# Patient Record
Sex: Male | Born: 1995
Health system: Southern US, Community
[De-identification: ages and names within clinical notes are randomized; demographics above are authoritative.]

## PROBLEM LIST (undated history)

## (undated) DIAGNOSIS — Z9109 Other allergy status, other than to drugs and biological substances: Secondary | ICD-10-CM

## (undated) DIAGNOSIS — Z8701 Personal history of pneumonia (recurrent): Secondary | ICD-10-CM

## (undated) DIAGNOSIS — S129XXA Fracture of neck, unspecified, initial encounter: Secondary | ICD-10-CM

## (undated) DIAGNOSIS — IMO0002 Reserved for concepts with insufficient information to code with codable children: Secondary | ICD-10-CM

## (undated) DIAGNOSIS — Z9289 Personal history of other medical treatment: Secondary | ICD-10-CM

## (undated) HISTORY — PX: WISDOM TOOTH EXTRACTION: SHX21

## (undated) HISTORY — DX: Personal history of pneumonia (recurrent): Z87.01

## (undated) HISTORY — DX: Personal history of other medical treatment: Z92.89

## (undated) HISTORY — DX: Reserved for concepts with insufficient information to code with codable children: IMO0002

---
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
## 2013-01-08 ENCOUNTER — Encounter: Payer: Self-pay | Admitting: Family Medicine

## 2013-01-08 ENCOUNTER — Ambulatory Visit: Payer: Self-pay | Admitting: Family Medicine

## 2013-01-09 ENCOUNTER — Encounter: Payer: Self-pay | Admitting: Family Medicine

## 2013-01-09 ENCOUNTER — Ambulatory Visit (INDEPENDENT_AMBULATORY_CARE_PROVIDER_SITE_OTHER)
Admission: RE | Admit: 2013-01-09 | Discharge: 2013-01-09 | Disposition: A | Discharge status: Home / Self Care | Payer: BC Managed Care – PPO | Source: Ambulatory Visit | Attending: Family Medicine | Admitting: Family Medicine

## 2013-01-09 ENCOUNTER — Ambulatory Visit (INDEPENDENT_AMBULATORY_CARE_PROVIDER_SITE_OTHER): Payer: BC Managed Care – PPO | Admitting: Family Medicine

## 2013-01-09 VITALS — BP 142/88 | HR 53 | Temp 97.5°F | Resp 20 | Ht 72.5 in | Wt 158.5 lb

## 2013-01-09 DIAGNOSIS — M7660 Achilles tendinitis, unspecified leg: Secondary | ICD-10-CM

## 2013-01-09 DIAGNOSIS — IMO0002 Reserved for concepts with insufficient information to code with codable children: Secondary | ICD-10-CM

## 2013-01-09 DIAGNOSIS — M7662 Achilles tendinitis, left leg: Secondary | ICD-10-CM

## 2013-01-09 DIAGNOSIS — S93402S Sprain of unspecified ligament of left ankle, sequela: Secondary | ICD-10-CM

## 2013-01-09 NOTE — Progress Notes (Signed)
Office Note 01/15/2013  CC:  Chief Complaint  Patient presents with  . Establish Care    NP to establish.; pt c/o possible athlete's feet, bilateral.    HPI:  Ricky [name] is a 17 y.o. White male who is here to establish care and discuss feet problem. Patient's most recent primary MD: Dr. Orson Aloe at [facility] in [place]. Old records were not reviewed prior to or during today's visit.  Describes recurrent chin splints x 2 yrs, also twisted left ankle repeatedly, most recently about 6-7 mo ago--he iced it and wore an akle support brace and it improved but since then it has returned --pain when he walks.  Describes the area as lateral ankle and heel of left foot.  Right ankle no problems.  No hx of MD eval for ankle.  Past Medical History  Diagnosis Date  . Persistent asthma     Multiple(>5) ED visits for acute asthma exacerbations from 2007-2014  . Environmental allergies   . History of pneumonia   . Neck fracture     C5 -C6    Past Surgical History  Procedure Laterality Date  . Wisdom tooth extraction      Family History  Problem Relation Age of Onset  . Arthritis Maternal Grandmother   . Hypertension Maternal Grandmother     History   Social History  . Marital Status: Single    Spouse Name: N/A    Number of Children: N/A  . Years of Education: N/A   Occupational History  . Not on file.   Social History Main Topics  . Smoking status: Never Smoker   . Smokeless tobacco: Not on file  . Alcohol Use: No  . Drug Use: No  . Sexually Active: Not on file   Other Topics Concern  . Not on file   Social History Narrative   Freshman at [employer].   [place]   Lives with parents.  Has 1 bro and 1 sister.   No T/A/Ds.    Outpatient Encounter Prescriptions as of 01/09/2013  Medication Sig Dispense Refill  . acetaminophen (TYLENOL) 500 MG tablet Take 1,000 mg by mouth every 6 (six) hours as needed. For pain.      Marland Kitchen albuterol (PROVENTIL HFA;VENTOLIN  HFA) 108 (90 BASE) MCG/ACT inhaler Inhale 2-4 puffs into the lungs every 2 (two) hours as needed. Shortness of breath and wheezing      . diphenhydrAMINE (BENADRYL) 25 mg capsule Take 1 capsule (25 mg total) by mouth every 6 (six) hours as needed for itching.  30 capsule  0  . EPINEPHrine (EPIPEN) 0.3 mg/0.3 mL DEVI Inject 0.3 mLs (0.3 mg total) into the muscle as needed.  1 Device  1  . ibuprofen (ADVIL,MOTRIN) 200 MG tablet Take 400 mg by mouth every 8 (eight) hours as needed. For pain.      . [DISCONTINUED] azithromycin (ZITHROMAX) 250 MG tablet Take 1 tablet (250 mg total) by mouth daily. Take first 2 tablets together, then 1 every day until finished.  6 tablet  0  . [DISCONTINUED] famotidine (PEPCID) 20 MG tablet Take 1 tablet (20 mg total) by mouth 2 (two) times daily.  30 tablet  0  . [DISCONTINUED] oxyCODONE-acetaminophen (PERCOCET/ROXICET) 5-325 MG per tablet Take 1 tablet by mouth every 6 (six) hours as needed for pain.  6 tablet  0  . [DISCONTINUED] predniSONE (DELTASONE) 20 MG tablet Take 3 tablets (60 mg total) by mouth daily.  15 tablet  0  No facility-administered encounter medications on file as of 01/09/2013.    Allergies  Allergen Reactions  . Penicillins Anaphylaxis  . Azithromycin Hives  . Oxycodone-Acetaminophen Hives    ROS Review of Systems  Constitutional: Negative for fever and fatigue.  HENT: Negative for congestion and sore throat.   Eyes: Negative for visual disturbance.  Respiratory: Negative for cough.   Cardiovascular: Negative for chest pain.  Gastrointestinal: Negative for nausea and abdominal pain.  Genitourinary: Negative for dysuria.  Musculoskeletal: Negative for back pain and joint swelling.  Skin: Negative for rash.  Neurological: Negative for weakness and headaches.  Hematological: Negative for adenopathy.    PE; Blood pressure 142/88, pulse 53, temperature 97.5 F (36.4 C), temperature source Oral, resp. rate 20, height 6' 0.5" (1.842 m),  weight 158 lb 8 oz (71.895 kg), SpO2 97.00%. Gen: Alert, well appearing.  Patient is oriented to person, place, time, and situation. Feet and ankles without any edema..  No rash.  Right ankle without tenderness or swelling.  Pain with palpation of left ankle/dorsum of foot just anterior to lateral malleolus.  Pain with general ROM of left ankle, also tenderness noted in distal achilles tendon.  Left ankle with increased laxity and pain with anterior drawer testing.  Talar tilt is ok.  Pertinent labs:  .none today  ASSESSMENT AND PLAN:   Ankle sprain Chronic symptoms. With some achilles tendonitis as well. Check x-ray today. Continue rest and brace. Refer to Dr. Pearletha Forge in Sports Medicine at [facility].    Return if symptoms worsen or fail to improve.

## 2013-01-14 ENCOUNTER — Encounter: Payer: Self-pay | Admitting: Family Medicine

## 2013-01-15 DIAGNOSIS — S93409A Sprain of unspecified ligament of unspecified ankle, initial encounter: Secondary | ICD-10-CM | POA: Insufficient documentation

## 2013-01-15 NOTE — Assessment & Plan Note (Addendum)
Chronic symptoms. With some achilles tendonitis as well. Check x-ray today. Continue rest and brace. Refer to Dr. Pearletha Forge in Sports Medicine at [facility].

## 2013-01-21 ENCOUNTER — Ambulatory Visit (INDEPENDENT_AMBULATORY_CARE_PROVIDER_SITE_OTHER): Payer: BC Managed Care – PPO | Admitting: Family Medicine

## 2013-01-21 ENCOUNTER — Encounter: Payer: Self-pay | Admitting: Family Medicine

## 2013-01-21 VITALS — BP 134/71 | HR 72 | Ht 73.0 in | Wt 155.0 lb

## 2013-01-21 DIAGNOSIS — M25572 Pain in left ankle and joints of left foot: Secondary | ICD-10-CM

## 2013-01-21 DIAGNOSIS — M25579 Pain in unspecified ankle and joints of unspecified foot: Secondary | ICD-10-CM

## 2013-01-21 NOTE — Patient Instructions (Addendum)
You have chronic ankle instability from prior sprains. Ice the area for 15 minutes at a time, 3-4 times a day Take aleve 1-2 tabs twice a day with food as needed. Ankle brace when painful and with sports for stability (can use as often as possible when walking also). Start home theraband exercises 3 sets of 10 once a day. When it feels comfortable add the balance exercises I showed you as well. Consider physical therapy for strengthening and balance exercises. If not improving as expected, we may repeat x-rays or consider further testing like an MRI. Follow up with me in 6 weeks for reevaluation.  You also have achilles tendinopathy. Tylenol or aleve as needed for pain Lowering/raise on a step exercises 3 x 10 once a day Can add heel walks, toe walks forward and backward as well Ice the area 15 minutes at a time 3-4 times a day as needed. Avoid uneven ground, hills as much as possible. Heel lifts to unload the achilles. Inserts with good arch support may be helpful. Consider formal physical therapy, nitro patches if not improving.

## 2013-01-21 NOTE — Progress Notes (Signed)
Subjective:    Patient ID: Ricky [name], male    DOB: 04-Feb-1996, 17 y.o.   MRN: [id number]  PCP: Dr. Milinda Cave  HPI 17 yo M here for left ankle pain.  Patient reports he has a history of multiple ankle sprains to left ankle. Started about 2 years ago when playing basketball - inversion injury. Never rehabilitated this. Uses a velcro brace and still ices it when it hurts. Bothers more when up and walking around. Any movements cause pain. Primary pain is lateral ankle but also having posterior pain now. Not taking any medications for this.  Past Medical History  Diagnosis Date  . Persistent asthma     Multiple(>5) ED visits for acute asthma exacerbations from 2007-2014  . Environmental allergies   . History of pneumonia   . Neck fracture     C5 -C6    Current Outpatient Prescriptions on File Prior to Visit  Medication Sig Dispense Refill  . albuterol (PROVENTIL HFA;VENTOLIN HFA) 108 (90 BASE) MCG/ACT inhaler Inhale 2-4 puffs into the lungs every 2 (two) hours as needed. Shortness of breath and wheezing      . EPINEPHrine (EPIPEN) 0.3 mg/0.3 mL DEVI Inject 0.3 mLs (0.3 mg total) into the muscle as needed.  1 Device  1   No current facility-administered medications on file prior to visit.    Past Surgical History  Procedure Laterality Date  . Wisdom tooth extraction      Allergies  Allergen Reactions  . Penicillins Anaphylaxis  . Azithromycin Hives  . Oxycodone-Acetaminophen Hives    History   Social History  . Marital Status: Single    Spouse Name: N/A    Number of Children: N/A  . Years of Education: N/A   Occupational History  . Not on file.   Social History Main Topics  . Smoking status: Never Smoker   . Smokeless tobacco: Not on file  . Alcohol Use: No  . Drug Use: No  . Sexually Active: Not on file   Other Topics Concern  . Not on file   Social History Narrative   Freshman at [employer].   [place].   Lives with parents.  Has 1  bro and 1 sister.   No T/A/Ds.    Family History  Problem Relation Age of Onset  . Arthritis Maternal Grandmother   . Hypertension Maternal Grandmother   . Sudden death Neg Hx   . Hyperlipidemia Neg Hx   . Heart attack Neg Hx   . Diabetes Neg Hx     BP 134/71  Pulse 72  Ht 6\' 1"  (1.854 m)  Wt 155 lb (70.308 kg)  BMI 20.45 kg/m2  Review of Systems See HPI above.    Objective:   Physical Exam Gen: NAD  L ankle: No gross deformity, swelling, ecchymoses FROM with 5/5 strength all directions. TTP over ATFL, peroneal tendons, achilles tendon at insertion.  No deltoid, medial malleolus, navicular, base 5th, other foot/ankle tenderness. 2+ ant drawer, 1+ talar tilt. Negative syndesmotic compression. Thompsons test negative. NV intact distally.    Assessment & Plan:  1. Left ankle pain - 2/2 combination of chronic ankle instability and achilles tendinopathy.  Declined formal physical therapy.  Shown home theraband strengthening exercises for ankle, balance exercises, and achilles strengthening exercises with handouts.  Heel lifts for achilles tendinopathy.  Icing, nsaids as needed.  ASO for stability. Avoid hills, uneven ground as much as possible.  F/u in 6 weeks or as needed for reevaluation.

## 2013-01-21 NOTE — Assessment & Plan Note (Signed)
2/2 combination of chronic ankle instability and achilles tendinopathy.  Declined formal physical therapy.  Shown home theraband strengthening exercises for ankle, balance exercises, and achilles strengthening exercises with handouts.  Heel lifts for achilles tendinopathy.  Icing, nsaids as needed.  ASO for stability. Avoid hills, uneven ground as much as possible.  F/u in 6 weeks or as needed for reevaluation.

## 2013-02-15 ENCOUNTER — Encounter: Payer: Self-pay | Admitting: Family Medicine

## 2013-02-15 ENCOUNTER — Ambulatory Visit (INDEPENDENT_AMBULATORY_CARE_PROVIDER_SITE_OTHER): Payer: BC Managed Care – PPO | Admitting: Family Medicine

## 2013-02-15 ENCOUNTER — Ambulatory Visit (INDEPENDENT_AMBULATORY_CARE_PROVIDER_SITE_OTHER)
Admission: RE | Admit: 2013-02-15 | Discharge: 2013-02-15 | Disposition: A | Discharge status: Home / Self Care | Payer: BC Managed Care – PPO | Source: Ambulatory Visit | Attending: Family Medicine | Admitting: Family Medicine

## 2013-02-15 VITALS — BP 136/90 | HR 61 | Temp 97.6°F | Resp 16 | Wt 157.2 lb

## 2013-02-15 DIAGNOSIS — M79672 Pain in left foot: Secondary | ICD-10-CM

## 2013-02-15 DIAGNOSIS — M79609 Pain in unspecified limb: Secondary | ICD-10-CM

## 2013-02-15 DIAGNOSIS — S93402A Sprain of unspecified ligament of left ankle, initial encounter: Secondary | ICD-10-CM

## 2013-02-15 DIAGNOSIS — M79673 Pain in unspecified foot: Secondary | ICD-10-CM | POA: Insufficient documentation

## 2013-02-15 DIAGNOSIS — S93409A Sprain of unspecified ligament of unspecified ankle, initial encounter: Secondary | ICD-10-CM

## 2013-02-15 NOTE — Patient Instructions (Signed)
Take ibuprofen 600mg  three times a day with food--take this for 1 week. Ice ankle 20 minutes on/off throughout the next 2 days. Use crutches prn, continue wearing ankle lace-up support.

## 2013-02-15 NOTE — Progress Notes (Signed)
OFFICE NOTE  02/15/2013  CC:  Chief Complaint  Patient presents with  . Ankle Injury    Pt c/o Left ankle injury from playing basketball yesterday w/swelling & pain     HPI: Patient is a 17 y.o. Caucasian male who is here for left ankle injury.  He has hx of chronic left ankle pain and instability s/p a sprain 2 yrs ago that he never rehabed. Landed on inverted left ankle yesterday playing basketball and he felt a pop.  Initially he could not bear wt on it.  +Swelling.  No meds taken.  +ice last night. Today he bear wt but hobbles on it.  Pertinent PMH:  Past Medical History  Diagnosis Date  . Persistent asthma     Multiple(>5) ED visits for acute asthma exacerbations from 2007-2014  . Environmental allergies   . History of pneumonia   . Neck fracture     C5 -C6  Recurrent left ankle sprain; chronic left ankle pain and instability.  MEDS:  Albuterol HFA 2 puffs q6h prn  PE: Blood pressure 136/90, pulse 61, temperature 97.6 F (36.4 C), temperature source Oral, resp. rate 16, weight 157 lb 4 oz (71.328 kg), SpO2 95.00%. Gen: Alert, well appearing.  Patient is oriented to person, place, time, and situation. Limping, no crutches or ankle support. Left ankle with diffuse mild swelling, without erythema.  Ankle ROM very limited due to pain.  I did not attempt talar tilt or anterior drawer testing due to patient's pain level. He is tender in all aspects of his left ankle, most prominently laterally and posteriorly. The distal 8 cm of the achilles tendon is tender including the calcaneal insertion site.  He also is tender on the plantar surface of his calcaneus and midfoot.  Also with tenderness of midfoot on dorsal surface. PT and DP pulses palpable bilat.    LAB: X -ray of left ankle and foot, radiologist's reading pending.  I suspect a small nondisplaced fracture of the posterior aspect of his left talus.  Otherwise no fracture seen.  IMPRESSION AND PLAN:  Acute left ankle  sprain, hx of chronic left ankle pain and instability. Now also with midfoot pain worrisome for ligamentous injury. Will refer to ortho ASAP. I recommended no wt bearing until he sees ortho--crutches rx given.  Wear ankle lace-up support. Ice ankle for the next 2d, ibuprofen 600mg  tid with food x 1 week.  An After Visit Summary was printed and given to the patient.  FOLLOW UP: to be determined (awaiting x-ray final reading and ortho appt set up to be done)

## 2013-10-16 ENCOUNTER — Ambulatory Visit: Payer: BC Managed Care – PPO | Admitting: Family Medicine

## 2013-10-17 ENCOUNTER — Encounter: Payer: Self-pay | Admitting: *Deleted

## 2013-10-17 ENCOUNTER — Ambulatory Visit (INDEPENDENT_AMBULATORY_CARE_PROVIDER_SITE_OTHER): Payer: BC Managed Care – PPO | Admitting: Nurse Practitioner

## 2013-10-17 ENCOUNTER — Encounter: Payer: Self-pay | Admitting: Nurse Practitioner

## 2013-10-17 VITALS — BP 146/69 | HR 63 | Temp 98.5°F | Ht 72.5 in | Wt 156.8 lb

## 2013-10-17 DIAGNOSIS — S99912A Unspecified injury of left ankle, initial encounter: Secondary | ICD-10-CM

## 2013-10-17 DIAGNOSIS — S99929A Unspecified injury of unspecified foot, initial encounter: Secondary | ICD-10-CM

## 2013-10-17 DIAGNOSIS — S99919A Unspecified injury of unspecified ankle, initial encounter: Secondary | ICD-10-CM

## 2013-10-17 DIAGNOSIS — S8990XA Unspecified injury of unspecified lower leg, initial encounter: Secondary | ICD-10-CM

## 2013-10-17 NOTE — Progress Notes (Signed)
   Subjective:    Patient ID: Ricky [name], male    DOB: [birth year], 18 y.o.   MRN: [id number]  Ankle Injury  The incident occurred more than 1 week ago (landed on heel 2 weeks ago playing basketball. This is a re-injury. Pt was seen 9 mos ago by PCP & sports med. ). The injury mechanism was a direct blow (doesn't think rolled). The pain is present in the left ankle. The quality of the pain is described as aching. The pain is mild. The pain has been intermittent since onset. Pertinent negatives include no inability to bear weight (painful weight bearing). The symptoms are aggravated by weight bearing and movement. He has tried ice for the symptoms. The treatment provided mild relief.      Review of Systems  Constitutional: Negative for fever and activity change.  Musculoskeletal: Positive for arthralgias (L ankle pain).       Objective:   Physical Exam  Vitals reviewed. Constitutional: He is oriented to person, place, and time. He appears well-developed and well-nourished.  HENT:  Head: Normocephalic and atraumatic.  Eyes: Conjunctivae are normal. Right eye exhibits no discharge. Left eye exhibits no discharge.  Cardiovascular: Normal rate.   Pulmonary/Chest: Effort normal.  Musculoskeletal: He exhibits edema and tenderness.  Lateral & medial ankle tenderness to palpation, painful, but FROM.   Neurological: He is alert and oriented to person, place, and time.  Skin: Skin is warm and dry.  Psychiatric: He has a normal mood and affect. His behavior is normal. Thought content normal.          Assessment & Plan:  1. Left ankle injury Chronic instability, re-injured 2 wa. See Dr Pearletha ForgeHudnall. Ice followed by heat. Wear ASO. Aspercream.  Weight bearing as tolerated. Avoid long walks. No basketball or running.

## 2013-10-17 NOTE — Progress Notes (Signed)
Pre-visit discussion using our clinic review tool. No additional management support is needed unless otherwise documented below in the visit note.  

## 2013-10-17 NOTE — Patient Instructions (Signed)
I have cut & pasted Dr Lazaro ArmsHudnall's instructions from May, '14. Please read. Please follow up with him. This is a chronic injury. You must rehabilitate your ankle as he has recommended to avoid surgery and/or further injury.  You have chronic ankle instability from prior sprains.  Ice the area for 15 minutes at a time, 3-4 times a day  Take aleve 1-2 tabs twice a day with food as needed.  Ankle brace when painful and with sports for stability (can use as often as possible when walking also).  Start home theraband exercises 3 sets of 10 once a day.  When it feels comfortable add the balance exercises I showed you as well.  Consider physical therapy for strengthening and balance exercises.  If not improving as expected, we may repeat x-rays or consider further testing like an MRI.  Follow up with me in 6 weeks for reevaluation.   You also have achilles tendinopathy.  Tylenol or aleve as needed for pain  Lowering/raise on a step exercises 3 x 10 once a day  Can add heel walks, toe walks forward and backward as well  Ice the area 15 minutes at a time 3-4 times a day as needed.  Avoid uneven ground, hills as much as possible.  Heel lifts to unload the achilles.  Inserts with good arch support may be helpful.  Consider formal physical therapy, nitro patches if not improving.

## 2013-11-25 ENCOUNTER — Ambulatory Visit: Payer: BC Managed Care – PPO | Admitting: Nurse Practitioner

## 2013-11-26 ENCOUNTER — Encounter: Payer: Self-pay | Admitting: Nurse Practitioner

## 2013-11-26 ENCOUNTER — Ambulatory Visit (INDEPENDENT_AMBULATORY_CARE_PROVIDER_SITE_OTHER): Payer: BC Managed Care – PPO | Admitting: Nurse Practitioner

## 2013-11-26 VITALS — BP 111/69 | HR 61 | Temp 98.4°F | Ht 72.5 in | Wt 166.2 lb

## 2013-11-26 DIAGNOSIS — M25476 Effusion, unspecified foot: Secondary | ICD-10-CM

## 2013-11-26 DIAGNOSIS — M25572 Pain in left ankle and joints of left foot: Secondary | ICD-10-CM

## 2013-11-26 DIAGNOSIS — M25473 Effusion, unspecified ankle: Secondary | ICD-10-CM

## 2013-11-26 DIAGNOSIS — M25579 Pain in unspecified ankle and joints of unspecified foot: Secondary | ICD-10-CM

## 2013-11-26 DIAGNOSIS — R141 Gas pain: Secondary | ICD-10-CM

## 2013-11-26 DIAGNOSIS — M25472 Effusion, left ankle: Secondary | ICD-10-CM

## 2013-11-26 DIAGNOSIS — R142 Eructation: Secondary | ICD-10-CM

## 2013-11-26 DIAGNOSIS — R143 Flatulence: Secondary | ICD-10-CM

## 2013-11-26 NOTE — Progress Notes (Signed)
Pre visit review using our clinic review tool, if applicable. No additional management support is needed unless otherwise documented below in the visit note. 

## 2013-11-26 NOTE — Assessment & Plan Note (Addendum)
Pt continues to have chronic swelling & pain, & instability of L ankle. He states foot rolls easily if not wearing ASO. Did not examine ankle today. He wishes to see PT, which has been suggested in past. Will refer to PT today.

## 2013-11-26 NOTE — Progress Notes (Signed)
Subjective:     Ricky [name] is a 18 y.o. male. He presents with c/o 2 episodes of left-sided chest pain. Pain occurred while at work, lasted a few seconds, described as sharp, became more intense with deep inspiration. Denies SOB, nausea, diaphoresis, palpitations, LE swelling, Diarrhea, fever. He also c/o ongoing swelling, pain, & instability of L ankle.    The following portions of the patient's history were reviewed and updated as appropriate: allergies, current medications, past medical history, past social history, past surgical history and problem list.  Review of Systems Pertinent items are noted in HPI.    Objective:    BP 111/69  Pulse 61  Temp(Src) 98.4 F (36.9 C) (Temporal)  Ht 6' 0.5" (1.842 m)  Wt 166 lb 4 oz (75.411 kg)  BMI 22.23 kg/m2  SpO2 99% BP 111/69  Pulse 61  Temp(Src) 98.4 F (36.9 C) (Temporal)  Ht 6' 0.5" (1.842 m)  Wt 166 lb 4 oz (75.411 kg)  BMI 22.23 kg/m2  SpO2 99% General appearance: alert, cooperative, appears stated age and no distress Head: Normocephalic, without obvious abnormality, atraumatic Eyes: negative findings: lids and lashes normal and conjunctivae and sclerae normal Lungs: clear to auscultation bilaterally Heart: regular rate and rhythm, S1, S2 normal, no murmur, click, rub or gallop Abdomen: soft, non-tender; bowel sounds normal; no masses,  no organomegaly    Assessment:    1. L ankle pain 2 abdominal gas pain     Plan:     1 ref to PT 2 take gasX or beeno prn

## 2013-11-26 NOTE — Patient Instructions (Signed)
Please go to Physical therapy. Take gas X or beeno as needed for gas pain.  Intestinal Gas and Gas Pains Intestinal gas is mostly produced by normal air swallowing and digestion of food.  CAUSES  Problems of excessive gas in infants are different than those in older children. If your baby seems to be having problems with too much gas and related pain, possible causes include:  Food intolerances. Healthy foods such as fruits, vegetables, whole grains and legumes (beans and peas) are often the worst offenders. That is because these foods are high in fiber, and fiber can lead to excess gas.  Lactose intolerance. Lactose is a sugar that occurs naturally in dairy products.   Swallowing air.  Some of that air finds its way into the lower digestive tract.  Gluten intolerance. Gluten is a protein found in wheat and some other grains. Some people cannot properly digest this protein. This problem can result in excess gas, diarrhea and even weight loss.  Antibiotic treatment can change the normal bacteria in the intestines.  Artificial additives. Examples of these are sweeteners found in some sugar-free foods, gums and candies. Even healthy people can develop gas and diarrhea when they eat these sweeteners. SYMPTOMS   Loose and/or foul smelling stools.  Restlessness.  Poor sleep.  Voluntary or involuntary passing of gas, either as belching or as flatus.  Sharp, jabbing pains or cramps in the belly. These pains may occur anywhere in the belly and can change locations quickly. The pain can be very intense.  Abdominal bloating (distension). avoid:  Fruit juices with high fructose content (apple, pear, grape, and prune juice).  Foods with artificial sweeteners (sugar-free drinks, candy, and gum).  Carbonated drinks.  Cow's milk if lactose intolerance is suspected. Drink soy milk or rice milk. Eat slowly and avoid swallowing a lot of air when eating.  Medications  Beano is available as  drops or a chew tablet. It is a dietary supplement that is supposed to relieve gas associated with eating many high fiber foods, including beans, broccoli, and whole grain breads, etc.  Lactase: If you have lactose intolerance, it may help to take a lactase enzyme tablet to help him digest milk. This is an alternative to avoiding cow's milk and other dairy product. Newer versions of these tablets can even be taken just once a day. SEEK MEDICAL CARE IF:   There is no improvement with any of the treatments listed above.  The symptoms seem to be getting more frequent and more intense. SEEK IMMEDIATE MEDICAL CARE IF:  You vomit bright red blood, or a coffee ground appearing material.  You have blood in the stools, or the stools turn black and tarry. Document Released: 06/19/2007 Document Revised: 11/14/2011 Document Reviewed: 06/19/2007 [facility] Patient Information 2014 [place], [place].

## 2013-12-26 IMAGING — CR DG ANKLE COMPLETE 3+V*L*
3 series · 3 of 3 positions shown · non-contrast
Comparison: 01/09/2013

CLINICAL DATA: Acute left ankle sprain, medial and lateral bony
tenderness, tenderness at heel and plantar aspect of foot

LEFT ANKLE COMPLETE - 3+ VIEW

[view not recorded (1 of 3)]
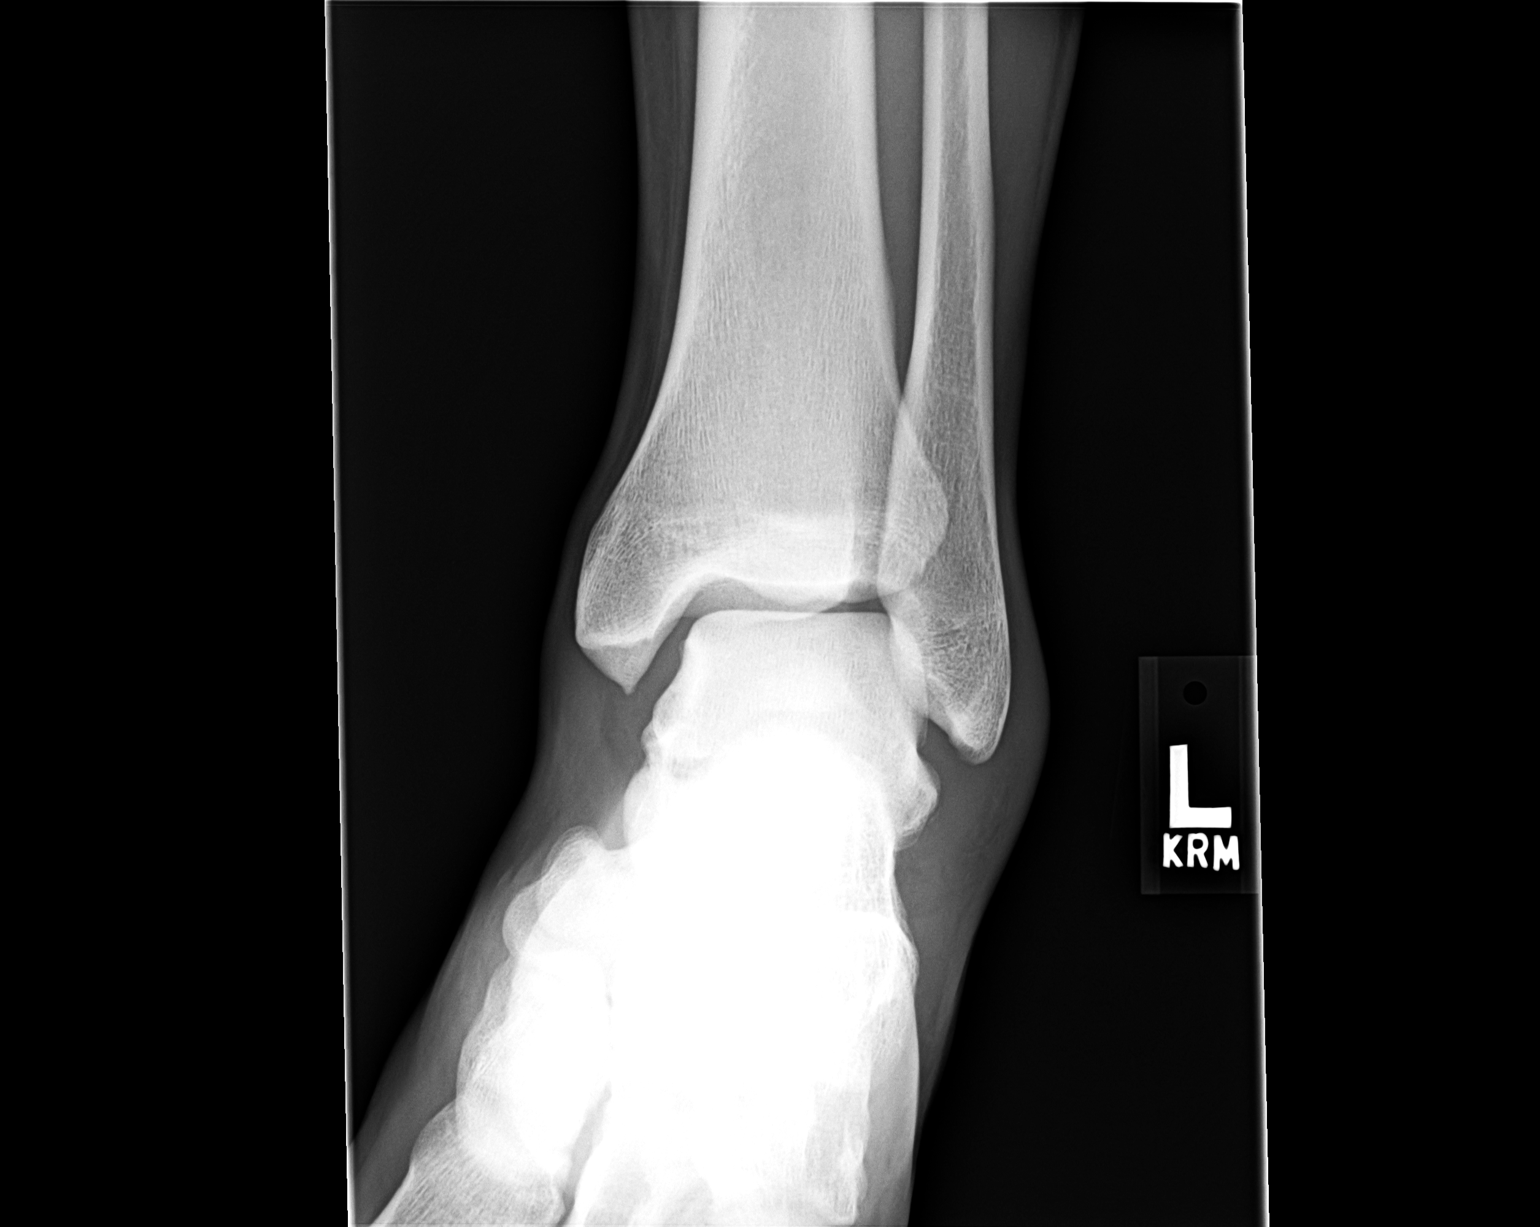

[view not recorded (2 of 3)]
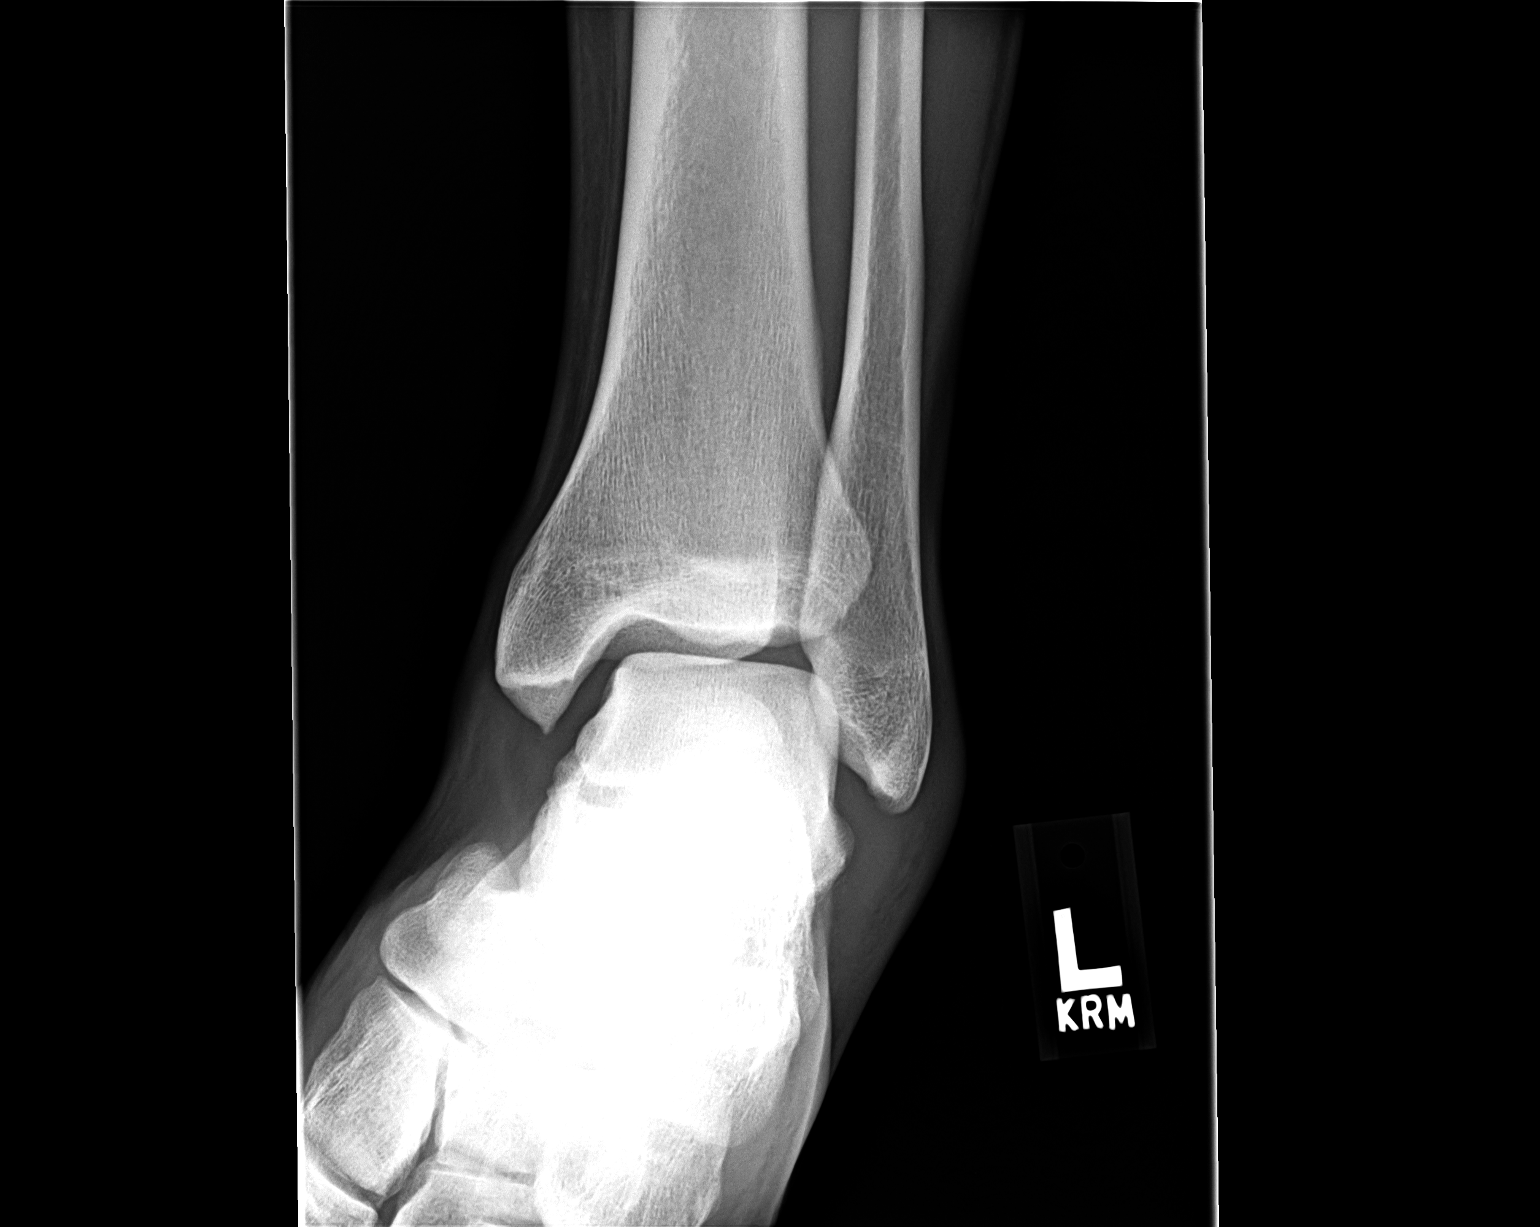

[view not recorded (3 of 3)]
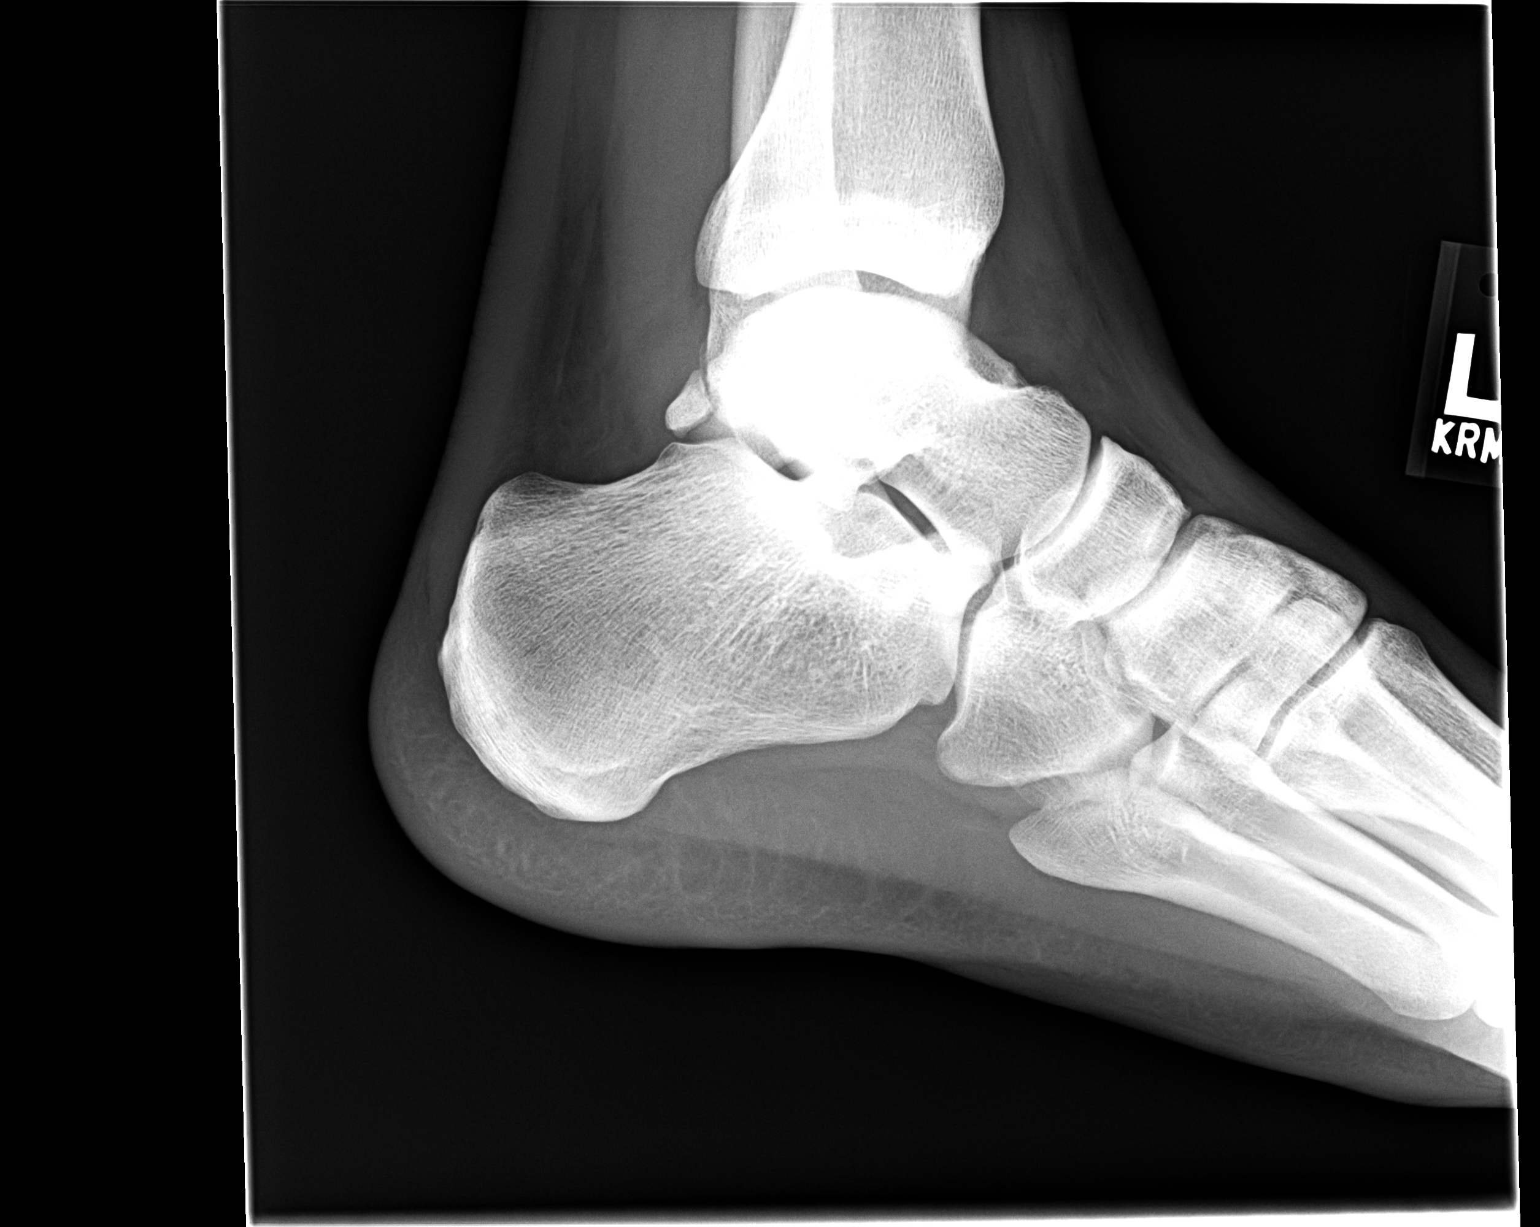

[3 of 3 positions shown; findings below may reference images not displayed]

FINDINGS: Minimal soft tissue swelling laterally.
Ankle mortise intact.
Osseous mineralization normal.
No acute fracture, dislocation or bone destruction.
Os trigonum incidentally noted.
IMPRESSION: No acute osseous abnormalities.

## 2014-03-21 ENCOUNTER — Encounter: Payer: Self-pay | Admitting: Family Medicine

## 2014-03-21 ENCOUNTER — Ambulatory Visit (INDEPENDENT_AMBULATORY_CARE_PROVIDER_SITE_OTHER): Payer: BC Managed Care – PPO | Admitting: Family Medicine

## 2014-03-21 VITALS — BP 120/72 | HR 65 | Temp 99.3°F | Resp 18 | Ht 72.5 in | Wt 163.0 lb

## 2014-03-21 DIAGNOSIS — J4520 Mild intermittent asthma, uncomplicated: Secondary | ICD-10-CM

## 2014-03-21 DIAGNOSIS — M24876 Other specific joint derangements of unspecified foot, not elsewhere classified: Secondary | ICD-10-CM

## 2014-03-21 DIAGNOSIS — S93402S Sprain of unspecified ligament of left ankle, sequela: Secondary | ICD-10-CM

## 2014-03-21 DIAGNOSIS — M25372 Other instability, left ankle: Secondary | ICD-10-CM

## 2014-03-21 DIAGNOSIS — B353 Tinea pedis: Secondary | ICD-10-CM

## 2014-03-21 DIAGNOSIS — M24873 Other specific joint derangements of unspecified ankle, not elsewhere classified: Secondary | ICD-10-CM

## 2014-03-21 DIAGNOSIS — IMO0002 Reserved for concepts with insufficient information to code with codable children: Secondary | ICD-10-CM

## 2014-03-21 DIAGNOSIS — J45909 Unspecified asthma, uncomplicated: Secondary | ICD-10-CM

## 2014-03-21 MED ORDER — ALBUTEROL SULFATE HFA 108 (90 BASE) MCG/ACT IN AERS
2.0000 | INHALATION_SPRAY | RESPIRATORY_TRACT | Status: DC | PRN
Start: 2014-03-21 — End: 2015-06-23

## 2014-03-21 NOTE — Patient Instructions (Signed)
Apply over the counter antifungal cream twice a day for 4 weeks minimum--continue to apply this for 1 full week after you see the rash/itching disappear.  Spray OTC antifungal spray or put OTC antifungal powder in your shoes. Change socks at mid-day to keep feet dry.

## 2014-03-21 NOTE — Progress Notes (Signed)
OFFICE NOTE  03/21/2014  CC:  Chief Complaint  Patient presents with  . Foot Problem    itching for a couple months  . Medication Refill    inhaler    HPI: Patient is a 18 y.o. Caucasian male who is here for itchy feet. Many weeks, off and on.  Better lately with use of OTC antifungal cream.  Mostly itchy/macerated between toes and some on sole of feet.  Works in [employer], boots, admits feet are sweaty a lot.  Says left ankle recurrent swelling, pain, and instability are still a problem and he asks for re-referral to PT.  He was referred by myself to ortho back in June 2014 for this but he did not go.  He was again referred to sports med MD (11/2013) by Maximino SarinLayne Weaver, FNP when he re-injured it but decided not to go to this or PT. He is now wanting to go to PT and asks for the phone # ([facility]).  Regarding asthma, he asks for new rescue inhaler b/c his is running low.  He says he uses this less than 2 times per month.  Pertinent PMH:  Past Medical History  Diagnosis Date  . Persistent asthma     Multiple(>5) ED visits for acute asthma exacerbations from 2007-2014  . Environmental allergies   . History of pneumonia   . Neck fracture     C5 -C6    MEDS:  Outpatient Prescriptions Prior to Visit  Medication Sig Dispense Refill  . albuterol (PROVENTIL HFA;VENTOLIN HFA) 108 (90 BASE) MCG/ACT inhaler Inhale 2-4 puffs into the lungs every 2 (two) hours as needed. Shortness of breath and wheezing      . EPINEPHrine (EPIPEN) 0.3 mg/0.3 mL DEVI Inject 0.3 mLs (0.3 mg total) into the muscle as needed.  1 Device  1   No facility-administered medications prior to visit.    PE: Blood pressure 120/72, pulse 65, temperature 99.3 F (37.4 C), temperature source Temporal, resp. rate 18, height 6' 0.5" (1.842 m), weight 163 lb (73.936 kg), SpO2 99.00%. Gen: Alert, well appearing.  Patient is oriented to person, place, time, and situation. FEET: mild pinkish macular rash between toes and on  soles of feet, with trace of macerated skin between some of his toes on both feet.    IMPRESSION AND PLAN:  1) Tinea pedis: Apply over the counter antifungal cream twice a day for 4 weeks minimum--continue to apply this for 1 full week after you see the rash/itching disappear. Spray OTC antifungal spray or put OTC antifungal powder in your shoes. Change socks at mid-day to keep feet dry.   2) Chronic ankle sprain/instability-ordered referral to [facility] again today.  3) Asthma, hx of persistent asthma but describes intermittent asthma now--gave RF of albuterol inhaler today.  An After Visit Summary was printed and given to the patient.  FOLLOW UP: prn

## 2014-03-21 NOTE — Progress Notes (Signed)
Pre visit review using our clinic review tool, if applicable. No additional management support is needed unless otherwise documented below in the visit note. 

## 2014-05-21 ENCOUNTER — Encounter: Payer: Self-pay | Admitting: Family Medicine

## 2014-05-21 ENCOUNTER — Ambulatory Visit (INDEPENDENT_AMBULATORY_CARE_PROVIDER_SITE_OTHER): Payer: BC Managed Care – PPO | Admitting: Family Medicine

## 2014-05-21 VITALS — BP 114/72 | HR 56 | Temp 98.9°F | Resp 18 | Ht 72.5 in | Wt 157.0 lb

## 2014-05-21 DIAGNOSIS — B356 Tinea cruris: Secondary | ICD-10-CM

## 2014-05-21 NOTE — Patient Instructions (Signed)
Dx: tinea cruris or "jock itch".  Lamisil cream (terbinafine is generic name): apply twice a day until you see no rash for 3 consecutive days.

## 2014-05-21 NOTE — Progress Notes (Signed)
Pre visit review using our clinic review tool, if applicable. No additional management support is needed unless otherwise documented below in the visit note. 

## 2014-05-21 NOTE — Progress Notes (Signed)
OFFICE NOTE  05/21/2014  CC:  Chief Complaint  Patient presents with  . Rash    groin area x 1 month   HPI: Patient is a 18 y.o. Caucasian male who is here for rash. Present about 1 mo, itchy, only in groin area.  Applied girlfriend's eczema cream--helped the itch but rash remained.  No OTC meds tried.     Pertinent PMH:  Past surgical, social, and family history reviewed and no changes noted since last office visit.  MEDS:  Outpatient Prescriptions Prior to Visit  Medication Sig Dispense Refill  . albuterol (PROVENTIL HFA;VENTOLIN HFA) 108 (90 BASE) MCG/ACT inhaler Inhale 2 puffs into the lungs every 2 (two) hours as needed. Shortness of breath and wheezing  1 Inhaler  1  . EPINEPHrine (EPIPEN) 0.3 mg/0.3 mL DEVI Inject 0.3 mLs (0.3 mg total) into the muscle as needed.  1 Device  1   No facility-administered medications prior to visit.    PE: Blood pressure 114/72, pulse 56, temperature 98.9 F (37.2 C), temperature source Temporal, resp. rate 18, height 6' 0.5" (1.842 m), weight 157 lb (71.215 kg), SpO2 100.00%. Gen: Alert, well appearing.  Patient is oriented to person, place, time, and situation. GROIN: R>L well demarcated pinkish papular rash with some superficial flaking.  No pustules or vesicles.  No tenderness or adenopathy.  IMPRESSION AND PLAN:  Tinea Cruris: recommended OTC lamisil cream bid until rash is no longer seen for 3 consecutive days. Don't apply steroid cream. Keep area as cool and dry as possible.  An After Visit Summary was printed and given to the patient.  FOLLOW UP: prn

## 2014-06-05 ENCOUNTER — Ambulatory Visit (HOSPITAL_BASED_OUTPATIENT_CLINIC_OR_DEPARTMENT_OTHER)
Admission: RE | Admit: 2014-06-05 | Discharge: 2014-06-05 | Disposition: A | Discharge status: Home / Self Care | Payer: BC Managed Care – PPO | Source: Ambulatory Visit | Attending: Diagnostic Radiology | Admitting: Diagnostic Radiology

## 2014-06-05 ENCOUNTER — Ambulatory Visit (INDEPENDENT_AMBULATORY_CARE_PROVIDER_SITE_OTHER): Payer: BC Managed Care – PPO | Admitting: Family Medicine

## 2014-06-05 ENCOUNTER — Encounter: Payer: Self-pay | Admitting: Family Medicine

## 2014-06-05 VITALS — BP 118/72 | HR 51 | Temp 98.2°F | Resp 18 | Ht 72.5 in | Wt 160.0 lb

## 2014-06-05 DIAGNOSIS — M546 Pain in thoracic spine: Secondary | ICD-10-CM | POA: Diagnosis not present

## 2014-06-05 DIAGNOSIS — S96912A Strain of unspecified muscle and tendon at ankle and foot level, left foot, initial encounter: Secondary | ICD-10-CM

## 2014-06-05 DIAGNOSIS — M542 Cervicalgia: Secondary | ICD-10-CM
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
## 2014-06-05 NOTE — Progress Notes (Signed)
Pre visit review using our clinic review tool, if applicable. No additional management support is needed unless otherwise documented below in the visit note. 

## 2014-06-05 NOTE — Patient Instructions (Signed)
Take 2 alleve twice a day with food.  Ice for 20 min to left foot several times a day

## 2015-06-18 DIAGNOSIS — Z9289 Personal history of other medical treatment: Secondary | ICD-10-CM

## 2015-06-18 HISTORY — DX: Personal history of other medical treatment: Z92.89

## 2015-06-23 ENCOUNTER — Ambulatory Visit (INDEPENDENT_AMBULATORY_CARE_PROVIDER_SITE_OTHER): Payer: BLUE CROSS/BLUE SHIELD | Admitting: Family Medicine

## 2015-06-23 ENCOUNTER — Encounter: Payer: Self-pay | Admitting: *Deleted

## 2015-06-23 ENCOUNTER — Encounter: Payer: Self-pay | Admitting: Family Medicine

## 2015-06-23 VITALS — BP 119/60 | HR 85 | Temp 98.1°F | Resp 20 | Ht 73.0 in | Wt 157.5 lb

## 2015-06-23 DIAGNOSIS — J0110 Acute frontal sinusitis, unspecified: Secondary | ICD-10-CM | POA: Diagnosis not present

## 2015-06-23 DIAGNOSIS — J321 Chronic frontal sinusitis: Secondary | ICD-10-CM | POA: Insufficient documentation

## 2015-06-23 MED ORDER — FLUTICASONE PROPIONATE 50 MCG/ACT NA SUSP: 2.0000 | Freq: Every day | NASAL | Status: DC

## 2015-06-23 MED ORDER — ALBUTEROL SULFATE HFA 108 (90 BASE) MCG/ACT IN AERS: 2.0000 | INHALATION_SPRAY | RESPIRATORY_TRACT | Status: DC | PRN

## 2015-06-23 MED ORDER — DOXYCYCLINE HYCLATE 100 MG PO TABS: 100.0000 mg | ORAL_TABLET | Freq: Two times a day (BID) | ORAL | Status: DC

## 2015-06-23 NOTE — Progress Notes (Signed)
Subjective:    Patient ID: Ricky [name], male    DOB: 1995/09/21, 21 y.o.   MRN: [id number]  HPI  Congestion: Patient presents for nasal congestion and headache that started last week, but started to get worse on Saturday. Patient states on Saturday morning his headache became worse, he points to above his eyes as location. He states in addition he has been experiencing bilateral ear discomfort, his chest hurts with deep breath, dry cough, congestion, sore throat, mild nausea, chills and myalgias. Patient denies any vomiting, but endorses one episode of diarrhea. Patient states that he is eating and drinking well, drinking plenty of water daily. He doesn't know anybody that has been exposed to that was sick. Patient does have a history of asthma and needs refill on his albuterol inhaler today. Patient denies any wheezing.  Past Medical History  Diagnosis Date  . Persistent asthma     Multiple(>5) ED visits for acute asthma exacerbations from 2007-2014  . Environmental allergies   . History of pneumonia   . Neck fracture (HCC)     C5 -C6--playing football.  No surgery required:  wore neck brace for several months.  . Hx of complete eye exam 06/18/2015    Dr. Dione BoozeGroat   Allergies  Allergen Reactions  . Penicillins Anaphylaxis  . Azithromycin Hives  . Oxycodone-Acetaminophen Hives   Social History   Social History  . Marital Status: Single    Spouse Name: N/A  . Number of Children: N/A  . Years of Education: N/A   Occupational History  . Not on file.   Social History Main Topics  . Smoking status: Never Smoker   . Smokeless tobacco: Not on file  . Alcohol Use: No  . Drug Use: No  . Sexual Activity: Not on file   Other Topics Concern  . 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   [place]   Lives with parents.  Has 1 bro and 1 sister.   No T/A/Ds.    Review of Systems Negative, with the exception of above mentioned in HPI       Objective:   Physical Exam BP 119/60 mmHg  Pulse 85  Temp(Src) 98.1 F (36.7 C) (Oral)  Resp 20  Ht 6\' 1"  (1.854 m)  Wt 157 lb 8 oz (71.442 kg)  BMI 20.78 kg/m2  SpO2 98% Gen: Afebrile. No acute distress. Nontoxic in appearance. Well developed, well nourished, physically fit male.  HENT: AT. [place]. Bilateral TM visualized and normal in appearance. MMM. Bilateral nares with erythema and mild swelling. Throat without erythema or exudates. Hoarseness on exam. Minimal cough on exam.  Eyes:Pupils Equal Round Reactive to light, Extraocular movements intact,  Conjunctiva without redness, discharge or icterus. Neck/lymp/endocrine: Supple,mild anterior cervical  lymphadenopathy, CV: RRR  Chest: CTAB, no wheeze or crackles Abd: Soft. flat. NTND. BS present. No Masses palpated.  MSK: Neck Full ROM.  Skin: No rashes, purpura or petechiae.  Neuro: Neg Kernigs    Assessment & Plan:  1. Acute frontal sinusitis, recurrence not specified Doxycycline every 12 hours for 10 days, Flonase for 2 weeks. mucinex (over the counter) for next few days to help dry up secretions. Hydrate.  - doxycycline (VIBRA-TABS) 100 MG tablet; Take 1 tablet (100 mg total) by mouth 2 (two) times daily.  Dispense: 20 tablet; Refill: 0 - fluticasone (FLONASE) 50 MCG/ACT nasal spray; Place 2 sprays into both nostrils daily.  Dispense: 16 g; Refill: 6  F/u prn

## 2015-06-23 NOTE — Patient Instructions (Signed)
Sinusitis, Adult Sinusitis is redness, soreness, and inflammation of the paranasal sinuses. Paranasal sinuses are air pockets within the bones of your face. They are located beneath your eyes, in the middle of your forehead, and above your eyes. In healthy paranasal sinuses, mucus is able to drain out, and air is able to circulate through them by way of your nose. However, when your paranasal sinuses are inflamed, mucus and air can become trapped. This can allow bacteria and other germs to grow and cause infection. Sinusitis can develop quickly and last only a short time (acute) or continue over a long period (chronic). Sinusitis that lasts for more than 12 weeks is considered chronic. CAUSES Causes of sinusitis include:  Allergies.  Structural abnormalities, such as displacement of the cartilage that separates your nostrils (deviated septum), which can decrease the air flow through your nose and sinuses and affect sinus drainage.  Functional abnormalities, such as when the small hairs (cilia) that line your sinuses and help remove mucus do not work properly or are not present. SIGNS AND SYMPTOMS Symptoms of acute and chronic sinusitis are the same. The primary symptoms are pain and pressure around the affected sinuses. Other symptoms include:  Upper toothache.  Earache.  Headache.  Bad breath.  Decreased sense of smell and taste.  A cough, which worsens when you are lying flat.  Fatigue.  Fever.  Thick drainage from your nose, which often is green and may contain pus (purulent).  Swelling and warmth over the affected sinuses. DIAGNOSIS Your health care provider will perform a physical exam. During your exam, your health care provider may perform any of the following to help determine if you have acute sinusitis or chronic sinusitis:  Look in your nose for signs of abnormal growths in your nostrils (nasal polyps).  Tap over the affected sinus to check for signs of  infection.  View the inside of your sinuses using an imaging device that has a light attached (endoscope). If your health care provider suspects that you have chronic sinusitis, one or more of the following tests may be recommended:  Allergy tests.  Nasal culture. A sample of mucus is taken from your nose, sent to a lab, and screened for bacteria.  Nasal cytology. A sample of mucus is taken from your nose and examined by your health care provider to determine if your sinusitis is related to an allergy. TREATMENT Most cases of acute sinusitis are related to a viral infection and will resolve on their own within 10 days. Sometimes, medicines are prescribed to help relieve symptoms of both acute and chronic sinusitis. These may include pain medicines, decongestants, nasal steroid sprays, or saline sprays. However, for sinusitis related to a bacterial infection, your health care provider will prescribe antibiotic medicines. These are medicines that will help kill the bacteria causing the infection. Rarely, sinusitis is caused by a fungal infection. In these cases, your health care provider will prescribe antifungal medicine. For some cases of chronic sinusitis, surgery is needed. Generally, these are cases in which sinusitis recurs more than 3 times per year, despite other treatments. HOME CARE INSTRUCTIONS  Drink plenty of water. Water helps thin the mucus so your sinuses can drain more easily.  Use a humidifier.  Inhale steam 3-4 times a day (for example, sit in the bathroom with the shower running).  Apply a warm, moist washcloth to your face 3-4 times a day, or as directed by your health care provider.  Use saline nasal sprays to help   moisten and clean your sinuses.  Take medicines only as directed by your health care provider.  If you were prescribed either an antibiotic or antifungal medicine, finish it all even if you start to feel better. SEEK IMMEDIATE MEDICAL CARE IF:  You have  increasing pain or severe headaches.  You have nausea, vomiting, or drowsiness.  You have swelling around your face.  You have vision problems.  You have a stiff neck.  You have difficulty breathing.   This information is not intended to replace advice given to you by your health care provider. Make sure you discuss any questions you have with your health care provider.   Document Released: 08/22/2005 Document Revised: 09/12/2014 Document Reviewed: 09/06/2011 Elsevier Interactive Patient Education 2016 [employer]  Doxycycline every 12 hours for 10 days.  Flonase for 2 weeks.  mucinex (over the counter) for next few days to help dry up secretions Hydrate.

## 2015-07-02 ENCOUNTER — Telehealth: Payer: Self-pay | Admitting: *Deleted

## 2015-07-02 NOTE — Telephone Encounter (Signed)
Pts mother Ricky [name] called stated that pt was having a reaction to the doxy that he was started on 06/23/15 when he came in for ov with Dr. Claiborne [name] for Sinus Infection. She stated that he has red splotchy places on his skin. I advised her the he should avoid exposure to the sun while he is taking this medication. She stated that he has been using sun screen and had been wearing long sleves and a hat when working out side. She stated that they have been putting aloe on it. I spoke to Dr. Milinda [name] and he stated that if the symptoms bearable and if he has been taking the doxy for 5 days or more to continue. Pts mother advised and voiced understanding and stated that pt only has two more days left.

## 2015-07-13 ENCOUNTER — Encounter: Payer: Self-pay | Admitting: Family Medicine

## 2015-07-13 ENCOUNTER — Ambulatory Visit (INDEPENDENT_AMBULATORY_CARE_PROVIDER_SITE_OTHER): Payer: BLUE CROSS/BLUE SHIELD | Admitting: Family Medicine

## 2015-07-13 VITALS — BP 131/80 | HR 85 | Temp 98.4°F | Resp 20 | Wt 162.0 lb

## 2015-07-13 DIAGNOSIS — J0111 Acute recurrent frontal sinusitis: Secondary | ICD-10-CM

## 2015-07-13 DIAGNOSIS — J0110 Acute frontal sinusitis, unspecified: Secondary | ICD-10-CM

## 2015-07-13 MED ORDER — LEVOFLOXACIN 500 MG PO TABS: 500.0000 mg | ORAL_TABLET | Freq: Every day | ORAL | Status: DC

## 2015-07-13 MED ORDER — FLUTICASONE PROPIONATE 50 MCG/ACT NA SUSP: 2.0000 | Freq: Every day | NASAL | Status: DC

## 2015-07-13 NOTE — Patient Instructions (Signed)
If no improvement in 10 days, please be seen again and we may need to send to ENT. Hydrate, nasal saline, antibiotic, flonase.   Sinusitis, Adult Sinusitis is redness, soreness, and inflammation of the paranasal sinuses. Paranasal sinuses are air pockets within the bones of your face. They are located beneath your eyes, in the middle of your forehead, and above your eyes. In healthy paranasal sinuses, mucus is able to drain out, and air is able to circulate through them by way of your nose. However, when your paranasal sinuses are inflamed, mucus and air can become trapped. This can allow bacteria and other germs to grow and cause infection. Sinusitis can develop quickly and last only a short time (acute) or continue over a long period (chronic). Sinusitis that lasts for more than 12 weeks is considered chronic. CAUSES Causes of sinusitis include:  Allergies.  Structural abnormalities, such as displacement of the cartilage that separates your nostrils (deviated septum), which can decrease the air flow through your nose and sinuses and affect sinus drainage.  Functional abnormalities, such as when the small hairs (cilia) that line your sinuses and help remove mucus do not work properly or are not present. SIGNS AND SYMPTOMS Symptoms of acute and chronic sinusitis are the same. The primary symptoms are pain and pressure around the affected sinuses. Other symptoms include:  Upper toothache.  Earache.  Headache.  Bad breath.  Decreased sense of smell and taste.  A cough, which worsens when you are lying flat.  Fatigue.  Fever.  Thick drainage from your nose, which often is green and may contain pus (purulent).  Swelling and warmth over the affected sinuses. DIAGNOSIS Your health care provider will perform a physical exam. During your exam, your health care provider may perform any of the following to help determine if you have acute sinusitis or chronic sinusitis:  Look in your  nose for signs of abnormal growths in your nostrils (nasal polyps).  Tap over the affected sinus to check for signs of infection.  View the inside of your sinuses using an imaging device that has a light attached (endoscope). If your health care provider suspects that you have chronic sinusitis, one or more of the following tests may be recommended:  Allergy tests.  Nasal culture. A sample of mucus is taken from your nose, sent to a lab, and screened for bacteria.  Nasal cytology. A sample of mucus is taken from your nose and examined by your health care provider to determine if your sinusitis is related to an allergy. TREATMENT Most cases of acute sinusitis are related to a viral infection and will resolve on their own within 10 days. Sometimes, medicines are prescribed to help relieve symptoms of both acute and chronic sinusitis. These may include pain medicines, decongestants, nasal steroid sprays, or saline sprays. However, for sinusitis related to a bacterial infection, your health care provider will prescribe antibiotic medicines. These are medicines that will help kill the bacteria causing the infection. Rarely, sinusitis is caused by a fungal infection. In these cases, your health care provider will prescribe antifungal medicine. For some cases of chronic sinusitis, surgery is needed. Generally, these are cases in which sinusitis recurs more than 3 times per year, despite other treatments. HOME CARE INSTRUCTIONS  Drink plenty of water. Water helps thin the mucus so your sinuses can drain more easily.  Use a humidifier.  Inhale steam 3-4 times a day (for example, sit in the bathroom with the shower running).  Apply a  warm, moist washcloth to your face 3-4 times a day, or as directed by your health care provider.  Use saline nasal sprays to help moisten and clean your sinuses.  Take medicines only as directed by your health care provider.  If you were prescribed either an  antibiotic or antifungal medicine, finish it all even if you start to feel better. SEEK IMMEDIATE MEDICAL CARE IF:  You have increasing pain or severe headaches.  You have nausea, vomiting, or drowsiness.  You have swelling around your face.  You have vision problems.  You have a stiff neck.  You have difficulty breathing.   This information is not intended to replace advice given to you by your health care provider. Make sure you discuss any questions you have with your health care provider.   Document Released: 08/22/2005 Document Revised: 09/12/2014 Document Reviewed: 09/06/2011 Elsevier Interactive Patient Education [employer].

## 2015-07-13 NOTE — Progress Notes (Signed)
   Subjective:    Patient ID: Ricky [name], male    DOB: Oct 22, 1995, 19 y.o.   MRN: [id number]  HPI  Sinus headache: Pt presents for headache, ear pain, throat discomfort, chills and nausea of 3 days duration. Pt was treated for similar symtptoms 2 month ago, with resolution of illness on flonase and doxycycline. Patient has not continued his flonase use. He does not take an antihistamine. He denies sick contacts. 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 He has not been eating or drinking well, but is tolerating PO. He states it hurts to swallow. He denies abdominal pain or diarrhea. Pt states he has taken benadryl last night and ibuprofen that has not helped.    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   Past Medical History  Diagnosis Date  . Persistent asthma     Multiple(>5) ED visits for acute asthma exacerbations from 2007-2014  . Environmental allergies   . History of pneumonia   . Neck fracture (HCC)     C5 -C6--playing football.  No surgery required:  wore neck brace for several months.  . Hx of complete eye exam 06/18/2015    Dr. Dione BoozeGroat   Allergies  Allergen Reactions  . Penicillins Anaphylaxis  . Azithromycin Hives  . Oxycodone-Acetaminophen Hives    Review of Systems Negative, with the exception of above mentioned in HPI     Objective:   Physical Exam BP 131/80 mmHg  Pulse 85  Temp(Src) 98.4 F (36.9 C) (Oral)  Resp 20  Wt 162 lb (73.483 kg)  SpO2 97% Gen: Afebrile. No acute distress. Lying on exam table. Fatigued.  HENT: AT. [place]. Bilateral TM visualized shiny/full in appearance. MMM. Bilateral nares with erythema no swelling . Throat without erythema or exudates. Thick green nasal draingage posterior pharynx. TTP frontal and maxillary sinus. No cough or hoarseness on exam.  Eyes:Pupils Equal Round Reactive to light, Extraocular movements intact,  Conjunctiva without redness, discharge or icterus. Neck/lymp/endocrine: Supple,Bialateral  cervical lymphadenopathy CV: RRR  Chest: CTAB, no wheeze or crackles Abd: Soft.flat. NTND. BS present.  Skin: No rashes, purpura or petechiae.      Assessment & Plan:  1. Acute recurrent frontal sinusitis - Hydrate, nasal saline, flonase, levaquin (for recurrent episode) - discussed with pt if no improvement/resolution of symptoms in 10 days, he is to be seen again and we may need to send to ENT. - levofloxacin (LEVAQUIN) 500 MG tablet; Take 1 tablet (500 mg total) by mouth daily.  Dispense: 10 tablet; Refill: 0 - fluticasone (FLONASE) 50 MCG/ACT nasal spray; Place 2 sprays into both nostrils daily.  Dispense: 16 g; Refill: 6  F/u 10 days

## 2015-07-15 ENCOUNTER — Ambulatory Visit: Payer: BLUE CROSS/BLUE SHIELD | Admitting: Family Medicine

## 2015-11-04 ENCOUNTER — Ambulatory Visit (INDEPENDENT_AMBULATORY_CARE_PROVIDER_SITE_OTHER): Payer: BLUE CROSS/BLUE SHIELD | Admitting: Family Medicine

## 2015-11-04 ENCOUNTER — Encounter: Payer: Self-pay | Admitting: Family Medicine

## 2015-11-04 VITALS — BP 122/77 | HR 72 | Temp 98.1°F | Resp 16 | Ht 73.0 in | Wt 169.8 lb

## 2015-11-04 DIAGNOSIS — J069 Acute upper respiratory infection, unspecified: Secondary | ICD-10-CM | POA: Diagnosis not present

## 2015-11-04 DIAGNOSIS — J111 Influenza due to unidentified influenza virus with other respiratory manifestations: Secondary | ICD-10-CM | POA: Diagnosis not present

## 2015-11-04 DIAGNOSIS — R69 Illness, unspecified: Principal | ICD-10-CM

## 2015-11-04 DIAGNOSIS — J4531 Mild persistent asthma with (acute) exacerbation: Secondary | ICD-10-CM | POA: Diagnosis not present

## 2015-11-04 MED ORDER — PREDNISONE 20 MG PO TABS: ORAL_TABLET | ORAL | Status: DC

## 2015-11-04 NOTE — Progress Notes (Signed)
OFFICE VISIT  11/04/2015   CC:  Chief Complaint  Patient presents with  . URI    x 4 days   HPI:    Patient is a 20 y.o. Caucasian male who presents for respiratory complaints. Onset 4-5 days ago: cough, nasal congestion, ST, achy, subj fever.  Was in bed x 24h, then felt better but mild URI sx's+ cough/wheeze remain.  No SOB.  Eating and drinking fine.  Used albut inhaler twice a day the last couple days. Also tried nyquil cold/flu formula. Prior to this illness he says that he had no albuterol requirement in the last couple of months.  Past Medical History  Diagnosis Date  . Persistent asthma     Multiple(>5) ED visits for acute asthma exacerbations from 2007-2014  . Environmental allergies   . History of pneumonia   . Neck fracture (HCC)     C5 -C6--playing football.  No surgery required:  wore neck brace for several months.  . Hx of complete eye exam 06/18/2015    Dr. Dione Booze    Past Surgical History  Procedure Laterality Date  . Wisdom tooth extraction      Outpatient Prescriptions Prior to Visit  Medication Sig Dispense Refill  . albuterol (PROVENTIL HFA;VENTOLIN HFA) 108 (90 BASE) MCG/ACT inhaler Inhale 2 puffs into the lungs every 2 (two) hours as needed. Shortness of breath and wheezing 1 Inhaler 1  . fluticasone (FLONASE) 50 MCG/ACT nasal spray Place 2 sprays into both nostrils daily. 16 g 6  . EPINEPHrine (EPIPEN) 0.3 mg/0.3 mL DEVI Inject 0.3 mLs (0.3 mg total) into the muscle as needed. (Patient not taking: Reported on 11/04/2015) 1 Device 1  . levofloxacin (LEVAQUIN) 500 MG tablet Take 1 tablet (500 mg total) by mouth daily. (Patient not taking: Reported on 11/04/2015) 10 tablet 0   No facility-administered medications prior to visit.    Allergies  Allergen Reactions  . Penicillins Anaphylaxis  . Azithromycin Hives  . Oxycodone-Acetaminophen Hives   ROS As per HPI  PE: Blood pressure 122/77, pulse 72, temperature 98.1 F (36.7 C), temperature source Oral,  resp. rate 16, height  (1.854 m), weight 169 lb 12 oz (76.998 kg), SpO2 95 %. VS: noted--normal. Gen: alert, NAD, NONTOXIC APPEARING. HEENT: eyes without injection, drainage, or swelling.  Ears: EACs clear, TMs with normal light reflex and landmarks.  Nose: Clear rhinorrhea, with some dried, crusty exudate adherent to mildly injected mucosa.  No purulent d/c.  No paranasal sinus TTP.  No facial swelling.  Throat and mouth without focal lesion.  No pharyngial swelling, erythema, or exudate.   Neck: supple, no LAD.   LUNGS: CTA bilat, nonlabored resps.  Some post-exhalation coughing.  Exp phase not prolonged. CV: RRR, no m/r/g. EXT: no c/c/e SKIN: no rash  LABS:  none  IMPRESSION AND PLAN:  Influenza-like illness, resolving for the most part but the bronchitis/RAD portion is still present to a significant degree. No sign of bacterial infection.  PLAN: Prednisone  qd x 5d.  Albuterol HFA 2 p q4h prn--he has inhaler at home. Get otc generic robitussin DM OR Mucinex DM and use as directed on the packaging for cough and congestion. Use otc generic saline nasal spray 2-3 times per day to irrigate/moisturize your nasal passages.  An After Visit Summary was printed and given to the patient.   FOLLOW UP: Return if symptoms worsen or fail to improve.  Signed:  Santiago Bumpers, MD  11/04/2015      

## 2015-11-04 NOTE — Patient Instructions (Signed)
Get otc generic robitussin DM OR Mucinex DM and use as directed on the packaging for cough and congestion. Use otc generic saline nasal spray 2-3 times per day to irrigate/moisturize your nasal passages.   

## 2015-11-04 NOTE — Progress Notes (Signed)
Pre visit review using our clinic review tool, if applicable. No additional management support is needed unless otherwise documented below in the visit note. 

## 2015-12-23 ENCOUNTER — Encounter: Payer: Self-pay | Admitting: Family Medicine

## 2015-12-23 ENCOUNTER — Ambulatory Visit (INDEPENDENT_AMBULATORY_CARE_PROVIDER_SITE_OTHER): Payer: BLUE CROSS/BLUE SHIELD | Admitting: Family Medicine

## 2015-12-23 ENCOUNTER — Encounter: Payer: Self-pay | Admitting: *Deleted

## 2015-12-23 VITALS — BP 116/72 | HR 68 | Temp 97.5°F | Resp 16 | Ht 73.0 in | Wt 167.8 lb

## 2015-12-23 DIAGNOSIS — S29012A Strain of muscle and tendon of back wall of thorax, initial encounter: Secondary | ICD-10-CM

## 2015-12-23 NOTE — Progress Notes (Signed)
OFFICE VISIT  12/23/2015   CC:  Chief Complaint  Patient presents with  . Back Pain    x 2 hours   HPI:    Patient is a 20 y.o. Caucasian male who presents for back pain.  Hurt back this morning at work---[employer]ing--while lifting up a heavy pipe with torso and arms.  Felt a pop in L scapula area, hurts to move or takes breaths.  Lifting his L arm makes it hurt worse.  No radiation of pain into arms or down back.  Has had similar strains before but never this bad he says.  He has not taken any medication. This back pain occurred about 2 hours ago.  Past Medical History  Diagnosis Date  . Persistent asthma     Multiple(>5) ED visits for acute asthma exacerbations from 2007-2014  . Environmental allergies   . History of pneumonia   . Neck fracture (HCC)     C5 -C6--playing football.  No surgery required:  wore neck brace for several months.  . Hx of complete eye exam 06/18/2015    Dr. Dione BoozeGroat    Past Surgical History  Procedure Laterality Date  . Wisdom tooth extraction      Outpatient Prescriptions Prior to Visit  Medication Sig Dispense Refill  . albuterol (PROVENTIL HFA;VENTOLIN HFA) 108 (90 BASE) MCG/ACT inhaler Inhale 2 puffs into the lungs every 2 (two) hours as needed. Shortness of breath and wheezing 1 Inhaler 1  . fluticasone (FLONASE) 50 MCG/ACT nasal spray Place 2 sprays into both nostrils daily. 16 g 6  . predniSONE (DELTASONE) 20 MG tablet 2 tabs po qd x 5d (Patient not taking: Reported on 12/23/2015) 10 tablet 0   No facility-administered medications prior to visit.    Allergies  Allergen Reactions  . Penicillins Anaphylaxis  . Azithromycin Hives  . Oxycodone-Acetaminophen Hives    ROS As per HPI  PE: Blood pressure 116/72, pulse 68, temperature 97.5 F (36.4 C), temperature source Oral, resp. rate 16, height 6\' 1"  (1.854 m), weight 167 lb 12 oz (76.091 kg), SpO2 98 %. Gen: Alert, well appearing.  Patient is oriented to person, place, time, and  situation. Moves slowly as if in a lot of pain. ROM of back: he is essentially intact but all motions hurt in the area of concern on L mid back area. TTP in paraspinous soft tissues diffusely at T5-T12 levels.  No spinous process tenderness.  Only a mild amount of discomfort with palpation of L scapula soft tissues.  LABS:  none  IMPRESSION AND PLAN:  Thoracic muscle strain:  Instructions: Rest Apply ice 20 min today every 3 hours. TAke ibuprofen 600 mg (3 over the counter tabs) TWICE per day WITH FOOD for the next 7 days. Do range-of-motion stretches of your back at least 3 times per day. Note excusing him from work from today through 12/26/15, may return 12/28/15.  An After Visit Summary was printed and given to the patient.  FOLLOW UP: Return if symptoms worsen or fail to improve.  Signed:  Santiago BumpersPhil McGowen, MD           12/23/2015

## 2015-12-23 NOTE — Patient Instructions (Signed)
Rest Apply ice 20 min today every 3 hours. TAke ibuprofen 600 mg (3 over the counter tabs) TWICE per day WITH FOOD for the next 7 days. Do range-of-motion stretches of your back at least 3 times per day.

## 2015-12-23 NOTE — Progress Notes (Signed)
Pre visit review using our clinic review tool, if applicable. No additional management support is needed unless otherwise documented below in the visit note. 

## 2016-02-25 DIAGNOSIS — D485 Neoplasm of uncertain behavior of skin: Secondary | ICD-10-CM | POA: Diagnosis not present

## 2016-03-07 ENCOUNTER — Encounter: Payer: Self-pay | Admitting: Family Medicine

## 2016-03-07 ENCOUNTER — Ambulatory Visit (INDEPENDENT_AMBULATORY_CARE_PROVIDER_SITE_OTHER): Payer: BLUE CROSS/BLUE SHIELD | Admitting: Family Medicine

## 2016-03-07 VITALS — BP 113/69 | HR 61 | Temp 98.1°F | Resp 16 | Ht 73.0 in | Wt 168.0 lb

## 2016-03-07 DIAGNOSIS — J4531 Mild persistent asthma with (acute) exacerbation: Secondary | ICD-10-CM | POA: Diagnosis not present

## 2016-03-07 DIAGNOSIS — J069 Acute upper respiratory infection, unspecified: Secondary | ICD-10-CM

## 2016-03-07 MED ORDER — PREDNISONE 20 MG PO TABS: ORAL_TABLET | ORAL | Status: DC

## 2016-03-07 NOTE — Progress Notes (Signed)
OFFICE VISIT  03/07/2016   CC:  Chief Complaint  Patient presents with  . Asthma    x 1 week   HPI:    Patient is a 20 y.o. Caucasian male who presents for about 1 week of nasal congestion/runny nose, cough productive of mucous, wheezing.  Some chest tightness, mild feeling of SOB.  Home use of alb HFA does help some.  Pt states the mucous is getting less and wheezing seems to be better today. No fevers.  Says ears feel full/swollen.  Some ST.  No signif HA.  No n/v/d. He has hx of persistent asthma but has never been one to be compliant with a daily preventative inhaler.  Past Medical History  Diagnosis Date  . Persistent asthma     Multiple(>5) ED visits for acute asthma exacerbations from 2007-2014  . Environmental allergies   . History of pneumonia   . Neck fracture (HCC)     C5 -C6--playing football.  No surgery required:  wore neck brace for several months.  . Hx of complete eye exam 06/18/2015    Dr. Dione BoozeGroat    Past Surgical History  Procedure Laterality Date  . Wisdom tooth extraction      Outpatient Prescriptions Prior to Visit  Medication Sig Dispense Refill  . albuterol (PROVENTIL HFA;VENTOLIN HFA) 108 (90 BASE) MCG/ACT inhaler Inhale 2 puffs into the lungs every 2 (two) hours as needed. Shortness of breath and wheezing 1 Inhaler 1  . fluticasone (FLONASE) 50 MCG/ACT nasal spray Place 2 sprays into both nostrils daily. 16 g 6   No facility-administered medications prior to visit.    Allergies  Allergen Reactions  . Penicillins Anaphylaxis  . Azithromycin Hives  . Oxycodone-Acetaminophen Hives    ROS As per HPI  PE: Blood pressure 113/69, pulse 61, temperature 98.1 F (36.7 C), temperature source Oral, resp. rate 16, height 6\' 1"  (1.854 m), weight 168 lb (76.204 kg), SpO2 97 %. VS: noted--normal. Gen: alert, NAD, NONTOXIC APPEARING. HEENT: eyes without injection, drainage, or swelling.  Ears: EACs clear, TMs with normal light reflex and landmarks.  Nose:  Clear rhinorrhea, with some dried, crusty exudate adherent to mildly injected mucosa.  No purulent d/c.  No paranasal sinus TTP.  No facial swelling.  Throat and mouth without focal lesion.  No pharyngial swelling, erythema, or exudate.   Neck: supple, no LAD.   LUNGS: CTA bilat, nonlabored resps.   CV: RRR, no m/r/g. EXT: no c/c/e SKIN: no rash    LABS:  none  IMPRESSION AND PLAN:  Acute asthma exacerbation, triggered by viral URI: prednisone 40mg  qd x 5d rx'd today. He'll continue his albuterol HFA 2 p q4h prn. Get otc generic robitussin DM OR Mucinex DM and use as directed on the packaging for cough and congestion. Use otc generic saline nasal spray 2-3 times per day to irrigate/moisturize your nasal passages.  An After Visit Summary was printed and given to the patient.  FOLLOW UP: Return if symptoms worsen or fail to improve.  Signed:  Santiago BumpersPhil Zoeie Ritter, MD           03/07/2016

## 2016-03-07 NOTE — Progress Notes (Signed)
Pre visit review using our clinic review tool, if applicable. No additional management support is needed unless otherwise documented below in the visit note. 

## 2016-06-28 ENCOUNTER — Ambulatory Visit (INDEPENDENT_AMBULATORY_CARE_PROVIDER_SITE_OTHER): Payer: BLUE CROSS/BLUE SHIELD | Admitting: Family Medicine

## 2016-06-28 ENCOUNTER — Encounter: Payer: Self-pay | Admitting: Family Medicine

## 2016-06-28 VITALS — BP 141/97 | HR 79 | Temp 98.2°F | Resp 16 | Wt 170.0 lb

## 2016-06-28 DIAGNOSIS — S0003XA Contusion of scalp, initial encounter: Secondary | ICD-10-CM | POA: Diagnosis not present

## 2016-06-28 DIAGNOSIS — S060X0A Concussion without loss of consciousness, initial encounter: Secondary | ICD-10-CM | POA: Diagnosis not present

## 2016-06-28 NOTE — Progress Notes (Signed)
OFFICE VISIT  06/28/2016   CC:  Chief Complaint  Patient presents with  . Head Injury    Hit on back of head, decreased hearing in left ear.   HPI:    Patient is a 20 y.o. Caucasian male who presents for head trauma 2 days ago.   Was accidentally hit by metal pipe in occiput.  No LOC.  Since that time he felt like his l ear feels muffled and is ringing. No blurry vision or dizziness or nausea.  Back of head still hurting and swollen.      Past Medical History:  Diagnosis Date  . Environmental allergies   . History of pneumonia   . Hx of complete eye exam 06/18/2015   Dr. Dione BoozeGroat  . Neck fracture (HCC)    C5 -C6--playing football.  No surgery required:  wore neck brace for several months.  . Persistent asthma    Multiple(>5) ED visits for acute asthma exacerbations from 2007-2014    Past Surgical History:  Procedure Laterality Date  . WISDOM TOOTH EXTRACTION      Outpatient Medications Prior to Visit  Medication Sig Dispense Refill  . albuterol (PROVENTIL HFA;VENTOLIN HFA) 108 (90 BASE) MCG/ACT inhaler Inhale 2 puffs into the lungs every 2 (two) hours as needed. Shortness of breath and wheezing 1 Inhaler 1  . fluticasone (FLONASE) 50 MCG/ACT nasal spray Place 2 sprays into both nostrils daily. 16 g 6  . predniSONE (DELTASONE) 20 MG tablet 2 tabs po qd x 5d (Patient not taking: Reported on 06/28/2016) 10 tablet 0   No facility-administered medications prior to visit.     Allergies  Allergen Reactions  . Penicillins Anaphylaxis  . Azithromycin Hives  . Oxycodone-Acetaminophen Hives    ROS As per HPI  PE: Blood pressure (!) 141/97, pulse 79, temperature 98.2 F (36.8 C), temperature source Temporal, resp. rate 16, weight 170 lb (77.1 kg), SpO2 96 %. Gen: Alert, well appearing.  Patient is oriented to person, place, time, and situation. Left occipital region with mild soft tissue swelling and tenderness.  No tenderness over the mastoid process on either side.    Both EACs are clear and middle ears appear clear of fluid, TMs are normal. KVQ:QVZDENT:Eyes: no injection, icteris, swelling, or exudate.  EOMI, PERRLA. Mouth: lips without lesion/swelling.  Oral mucosa pink and moist. Oropharynx without erythema, exudate, or swelling.  Neuro: CN 2-12 intact bilaterally, strength 5/5 in proximal and distal upper extremities and lower extremities bilaterally.  No sensory deficits.  No tremor.  No disdiadochokinesis.  No ataxia.  Upper extremity and lower extremity DTRs symmetric.  No pronator drift.  LABS:  None  Audiogram today:   Hearing Screening   125Hz  250Hz  500Hz  1000Hz  2000Hz  3000Hz  4000Hz  6000Hz  8000Hz   Right ear:   20 20 20 20 20     Left ear:   20 30 55 55 40     IMPRESSION AND PLAN:  Concussion with occipital scalp contusion. I think his hearing impairment is a symptom of his concussion. I don't feel that imaging is warrented at this time.  He acts perfectly well here today. He has been driving w/out problem and going to work as usual. I'll see him back in 1 week to recheck concussion w/ diminished L ear hearing and scalp contusion. Continue applying ice to the contusion.  An After Visit Summary was printed and given to the patient.  FOLLOW UP: Return in about 1 week (around 07/05/2016).  Signed:  Santiago BumpersPhil Tyrina Hines, MD  06/28/2016     

## 2016-06-28 NOTE — Progress Notes (Signed)
Pre visit review using our clinic review tool, if applicable. No additional management support is needed unless otherwise documented below in the visit note. 

## 2016-07-05 ENCOUNTER — Encounter: Payer: Self-pay | Admitting: Family Medicine

## 2016-07-05 ENCOUNTER — Ambulatory Visit (INDEPENDENT_AMBULATORY_CARE_PROVIDER_SITE_OTHER): Payer: BLUE CROSS/BLUE SHIELD | Admitting: Family Medicine

## 2016-07-05 VITALS — BP 127/89 | HR 100 | Temp 98.1°F | Resp 16 | Wt 166.8 lb

## 2016-07-05 DIAGNOSIS — H9192 Unspecified hearing loss, left ear: Secondary | ICD-10-CM | POA: Diagnosis not present

## 2016-07-05 DIAGNOSIS — S060X9D Concussion with loss of consciousness of unspecified duration, subsequent encounter: Secondary | ICD-10-CM | POA: Diagnosis not present

## 2016-07-05 NOTE — Progress Notes (Signed)
OFFICE VISIT  07/05/2016   CC:  Chief Complaint  Patient presents with  . Follow-up    concussion   HPI:    Patient is a 20 y.o. Caucasian male who presents for 1 week f/u concussion that resulted in L sided scalp hematoma and decreased hearing in L ear. Head doesn't hurt nearly as bad, knot has gone down some.  Has noted popping sound in L ear when applying ice to L side of head on 2 occasions.  This was only brief.  Otherwise he still feels like his hearing is impaired.  He feels like he hears out of it a little bit better overall. Has had some URI sx's over the last 10d.  No nausea, no dizziness, no vision c/o, no focal weakness.   No memory complaints or cognitive dysfunction.  He is working and going through his normal routine w/out problem.   Past Medical History:  Diagnosis Date  . Environmental allergies   . History of pneumonia   . Hx of complete eye exam 06/18/2015   Dr. Dione BoozeGroat  . Neck fracture (HCC)    C5 -C6--playing football.  No surgery required:  wore neck brace for several months.  . Persistent asthma    Multiple(>5) ED visits for acute asthma exacerbations from 2007-2014    Past Surgical History:  Procedure Laterality Date  . WISDOM TOOTH EXTRACTION      Outpatient Medications Prior to Visit  Medication Sig Dispense Refill  . albuterol (PROVENTIL HFA;VENTOLIN HFA) 108 (90 BASE) MCG/ACT inhaler Inhale 2 puffs into the lungs every 2 (two) hours as needed. Shortness of breath and wheezing 1 Inhaler 1  . fluticasone (FLONASE) 50 MCG/ACT nasal spray Place 2 sprays into both nostrils daily. 16 g 6   No facility-administered medications prior to visit.     Allergies  Allergen Reactions  . Penicillins Anaphylaxis  . Azithromycin Hives  . Oxycodone-Acetaminophen Hives    ROS As per HPI  PE: Blood pressure 127/89, pulse 100, temperature 98.1 F (36.7 C), temperature source Oral, resp. rate 16, weight 166 lb 12.8 oz (75.7 kg), SpO2 96 %. VS:  noted--normal. Gen: alert, NAD, NONTOXIC APPEARING. HEENT:  Head without any tenderness but still a small scalp hematoma palpable to inferior and left of vertex.  Eyes without injection, drainage, or swelling.  Ears: EACs clear, TMs with normal light reflex and landmarks.  Nose: Clear rhinorrhea, with some dried, crusty exudate adherent to mildly injected mucosa.  No purulent d/c.  No paranasal sinus TTP.  No facial swelling.  Throat and mouth without focal lesion.  No pharyngial swelling, erythema, or exudate.   Neck: supple, no LAD.   LUNGS: CTA bilat, nonlabored resps.   CV: RRR, no m/r/g. EXT: no c/c/e SKIN: no rash Neuro: CN 2-12 intact bilaterally, strength 5/5 in proximal and distal upper extremities and lower extremities bilaterally.  No tremor.  No ataxia.    LABS:  none  IMPRESSION AND PLAN:  Concussion, mild sx's--with scalp hematoma and decreased hearing in L ear noted after the trauma. Everything is improving, but the hearing is taking the longest to return to normal.  I think some eustachian tube dysfunction is playing a role, too. Plan is to use some nasal sprays to try to maximize potential for opening eustachian tube. Instructions: Buy otc generic saline nasal spray: use 2 sprays in each nostril and then blow nose---do this 2-3 times per day.  Buy otc generic afrin nasal spray: use as directed on packaging.  Do not use this for more than 3 consecutive days.  An After Visit Summary was printed and given to the patient.  FOLLOW UP: Return in about 2 weeks (around 07/19/2016) for f/u concussion and dec hearing.  If hearing still not signif improved at this next f/u will refer to ENT to see if they can help in determining if the impairment is related to nerve damage or to more middle ear pathology.  Signed:  Santiago BumpersPhil McGowen, MD           07/05/2016

## 2016-07-05 NOTE — Patient Instructions (Signed)
Buy otc generic saline nasal spray: use 2 sprays in each nostril and then blow nose---do this 2-3 times per day.  Buy otc generic afrin nasal spray: use as directed on packaging.  Do not use this for more than 3 consecutive days.

## 2016-07-05 NOTE — Progress Notes (Signed)
Pre visit review using our clinic review tool, if applicable. No additional management support is needed unless otherwise documented below in the visit note. 

## 2016-07-12 ENCOUNTER — Encounter: Payer: Self-pay | Admitting: Family Medicine

## 2016-07-12 ENCOUNTER — Ambulatory Visit (INDEPENDENT_AMBULATORY_CARE_PROVIDER_SITE_OTHER): Payer: BLUE CROSS/BLUE SHIELD | Admitting: Family Medicine

## 2016-07-12 VITALS — BP 139/79 | HR 80 | Temp 98.0°F | Resp 20 | Wt 172.5 lb

## 2016-07-12 DIAGNOSIS — J209 Acute bronchitis, unspecified: Secondary | ICD-10-CM

## 2016-07-12 DIAGNOSIS — J0111 Acute recurrent frontal sinusitis: Secondary | ICD-10-CM | POA: Diagnosis not present

## 2016-07-12 DIAGNOSIS — R062 Wheezing: Secondary | ICD-10-CM | POA: Diagnosis not present

## 2016-07-12 MED ORDER — LEVOFLOXACIN 500 MG PO TABS: 500.0000 mg | ORAL_TABLET | Freq: Every day | ORAL | 0 refills | Status: DC

## 2016-07-12 MED ORDER — IPRATROPIUM-ALBUTEROL 0.5-2.5 (3) MG/3ML IN SOLN
3.0000 mL | Freq: Once | RESPIRATORY_TRACT | Status: AC
  Administered 2016-07-12: 3 mL via RESPIRATORY_TRACT

## 2016-07-12 MED ORDER — PREDNISONE 50 MG PO TABS: 50.0000 mg | ORAL_TABLET | Freq: Every day | ORAL | 0 refills | Status: DC

## 2016-07-12 NOTE — Progress Notes (Signed)
Ricky HarvestDalton D Hartgrove , [birth year], 20 y.o., male MRN: [id number] Patient Care Team    Relationship Specialty Notifications Start End  Jeoffrey MassedPhilip H McGowen, MD PCP - General Family Medicine  01/09/13     CC: nasal congestion Subjective: Pt presents for an acute OV with complaints of nasal congestion of 6 days  duration.  Associated symptoms include nasal congestion, ear pressure, productive cough, chills, diarrhea and wheezing. He has not used his albuterol inhaler today. He denies fever, nausea, dizziness or vomiting. Pt has tried nothing to ease their symptoms.   Allergies  Allergen Reactions  . Penicillins Anaphylaxis  . Azithromycin Hives  . Oxycodone-Acetaminophen Hives   Social History  Substance Use Topics  . Smoking status: Never Smoker  . Smokeless tobacco: Never Used  . Alcohol use No   Past Medical History:  Diagnosis Date  . Environmental allergies   . History of pneumonia   . Hx of complete eye exam 06/18/2015   Dr. Dione BoozeGroat  . Neck fracture (HCC)    C5 -C6--playing football.  No surgery required:  wore neck brace for several months.  . Persistent asthma    Multiple(>5) ED visits for acute asthma exacerbations from 2007-2014   Past Surgical History:  Procedure Laterality Date  . WISDOM TOOTH EXTRACTION     Family History  Problem Relation Age of Onset  . Arthritis Maternal Grandmother   . Hypertension Maternal Grandmother   . Sudden death Neg Hx   . Hyperlipidemia Neg Hx   . Heart attack Neg Hx   . Diabetes Neg Hx      Medication List       Accurate as of 07/12/16  2:15 PM. Always use your most recent med list.          albuterol 108 (90 Base) MCG/ACT inhaler Commonly known as:  PROVENTIL HFA;VENTOLIN HFA Inhale 2 puffs into the lungs every 2 (two) hours as needed. Shortness of breath and wheezing   fluticasone 50 MCG/ACT nasal spray Commonly known as:  FLONASE Place 2 sprays into both nostrils daily.       No results found for this or any previous  visit (from the past 24 hour(s)). No results found.   ROS: Negative, with the exception of above mentioned in HPI   Objective:  BP 139/79 (BP Location: Left Arm, Patient Position: Sitting, Cuff Size: Normal)   Pulse 80   Temp 98 F (36.7 C)   Resp 20   Wt 172 lb 8 oz (78.2 kg)   SpO2 97%   BMI 22.76 kg/m  Body mass index is 22.76 kg/m. Gen: Afebrile. No acute distress. Nontoxic in appearance, well developed, well nourished. caucasian male.  HENT: AT. [place]. Bilateral TM visualized bilateral air fluid levels. . MMM, no oral lesions. Bilateral nares mild erythema and swelling. Throat without erythema or exudates. Cough, hoarseness, no TTP. Eyes:Pupils Equal Round Reactive to light, Extraocular movements intact,  Conjunctiva without redness, discharge or icterus. Neck/lymp/endocrine: Supple,bilateral cervical lymphadenopathy CV: RRR  Chest: wheeze RLL. Otherwise CTAB.  Good air movement.  Abd: Soft. NTND. BS present. no Masses palpated. No rebound or guarding.  Neuro:  Normal gait. PERLA. EOMi. Alert. Oriented x3   Assessment/Plan: Ricky [name] is a 20 y.o. male present for acute OV for  Wheezing/Acute bronchitis, unspecified organism - Office neb tx, cleared wheezing.  - LQ 500 Qd x 5 days - rest, hydrate - flonase, Mucinex DM - F/U 1 week with PCP if no improvement.   >  25 minutes spent with patient, >50% of time spent face to face    electronically signed by:  Felix Pacinienee Kuneff, DO  [facility] - [place]

## 2016-07-12 NOTE — Patient Instructions (Signed)
Start antibiotic once a day for 5 days. Prednisone also called in for a 5 day course.  Rest and hydrate.  Use flonase and mucinex DM for cough/mucous production.  Use your inhaler every 6 hours as needed during illness.   You appear to have a sinus infection with some bronchitis features.

## 2016-07-20 ENCOUNTER — Encounter: Payer: Self-pay | Admitting: Family Medicine

## 2016-07-20 ENCOUNTER — Ambulatory Visit (INDEPENDENT_AMBULATORY_CARE_PROVIDER_SITE_OTHER): Payer: BLUE CROSS/BLUE SHIELD | Admitting: Family Medicine

## 2016-07-20 VITALS — BP 113/75 | HR 98 | Temp 98.8°F | Resp 16 | Wt 175.8 lb

## 2016-07-20 DIAGNOSIS — H6982 Other specified disorders of Eustachian tube, left ear: Secondary | ICD-10-CM | POA: Diagnosis not present

## 2016-07-20 DIAGNOSIS — J069 Acute upper respiratory infection, unspecified: Secondary | ICD-10-CM

## 2016-07-20 DIAGNOSIS — S060X0D Concussion without loss of consciousness, subsequent encounter: Secondary | ICD-10-CM | POA: Diagnosis not present

## 2016-07-20 NOTE — Progress Notes (Signed)
OFFICE VISIT  07/20/2016   CC:  Chief Complaint  Patient presents with  . Follow-up    concussion   HPI:    Patient is a 20 y.o. Caucasian male who presents for 2 week f/u concussion. Says he feels better overall, just still with some congestion and cough in morning. Still with hearing in L ear going in and out--like it's popping--intermittently.  At times he says his hearing is normal.  No HAs.  No vision c/o.  Eating and drinking well.  Working, doing daily activities w/out problems. No dizziness, no mood problems, no memory impairment, no focal weakness, no paresthesias.  Saw Dr. Claiborne BillingsKuneff about a week ago for resp sx's, took levaquin 500 qd x 5d---feeling much better. Denies wheezing or sob or fevers.  Past Medical History:  Diagnosis Date  . Environmental allergies   . History of pneumonia   . Hx of complete eye exam 06/18/2015   Dr. Dione BoozeGroat  . Neck fracture (HCC)    C5 -C6--playing football.  No surgery required:  wore neck brace for several months.  . Persistent asthma    Multiple(>5) ED visits for acute asthma exacerbations from 2007-2014    Past Surgical History:  Procedure Laterality Date  . WISDOM TOOTH EXTRACTION      Outpatient Medications Prior to Visit  Medication Sig Dispense Refill  . albuterol (PROVENTIL HFA;VENTOLIN HFA) 108 (90 BASE) MCG/ACT inhaler Inhale 2 puffs into the lungs every 2 (two) hours as needed. Shortness of breath and wheezing 1 Inhaler 1  . fluticasone (FLONASE) 50 MCG/ACT nasal spray Place 2 sprays into both nostrils daily. 16 g 6  . levofloxacin (LEVAQUIN) 500 MG tablet Take 1 tablet (500 mg total) by mouth daily. 5 tablet 0  . predniSONE (DELTASONE) 50 MG tablet Take 1 tablet (50 mg total) by mouth daily with breakfast. 5 tablet 0   No facility-administered medications prior to visit.     Allergies  Allergen Reactions  . Penicillins Anaphylaxis  . Azithromycin Hives  . Oxycodone-Acetaminophen Hives  . Doxycycline Rash     ROS As per HPI  PE: Blood pressure 113/75, pulse 98, temperature 98.8 F (37.1 C), temperature source Temporal, resp. rate 16, weight 175 lb 12.8 oz (79.7 kg), SpO2 98 %. Gen: Alert, well appearing.  Patient is oriented to person, place, time, and situation. AFFECT: pleasant, lucid thought and speech. HEAD: very small left occipitoparietal region bump on scalp--resolving hematoma. HEENT: eyes without injection, drainage, or swelling.  Ears: EACs clear, TMs with normal light reflex and landmarks.  Nose: Clear rhinorrhea, with some dried, crusty exudate adherent to mildly injected mucosa.  No purulent d/c.  No paranasal sinus TTP.  No facial swelling.  Throat and mouth without focal lesion.  No pharyngial swelling, erythema, or exudate.   Neck: supple, no LAD.   Neuro: CN 2-12 intact bilaterally, strength 5/5 in proximal and distal upper extremities and lower extremities bilaterally.  No tremor.    No ataxia.    LABS:  none  IMPRESSION AND PLAN:  1) Concussion: resolved. Reassured pt. Signs/symptoms to call or return for were reviewed and pt expressed understanding.  2) Eustachian tube dysfunction: discussed ongoing use of saline nasal spray and otc steroid nasal spray. Signs/symptoms to call or return for were reviewed and pt expressed understanding.  3) Viral URI with cough, was wheezing. All sx's except mild URI sx's resolved.  An After Visit Summary was printed and given to the patient.  FOLLOW UP: No Follow-up on  file.  Signed:  Santiago BumpersPhil McGowen, MD           07/20/2016

## 2016-09-27 ENCOUNTER — Ambulatory Visit (INDEPENDENT_AMBULATORY_CARE_PROVIDER_SITE_OTHER): Payer: BLUE CROSS/BLUE SHIELD | Admitting: Family Medicine

## 2016-09-27 ENCOUNTER — Encounter: Payer: Self-pay | Admitting: Family Medicine

## 2016-09-27 VITALS — BP 106/72 | HR 70 | Temp 98.2°F | Resp 16 | Wt 171.0 lb

## 2016-09-27 DIAGNOSIS — J069 Acute upper respiratory infection, unspecified: Secondary | ICD-10-CM

## 2016-09-27 DIAGNOSIS — B349 Viral infection, unspecified: Secondary | ICD-10-CM

## 2016-09-27 NOTE — Patient Instructions (Signed)
Take three otc generic ibuprofen tabs with food twice per day until you are not having any headache or body aches anymore.  Buy otc generic saline nasal spray and use in nose 3-4 times per day.

## 2016-09-27 NOTE — Progress Notes (Signed)
OFFICE VISIT  09/27/2016   CC:  Chief Complaint  Patient presents with  . Sinusitis    Head congestion, ear pain   HPI:    Patient is a 21 y.o.  male who presents for respiratory complaints. Onset about 5 d/a, ear pain, nasal cong/runny nose, ST, HA.  Slight cough. No fever.  Some body aches.  Very fatigued.  No n/v/d.  No wheezing, chest tightness, or sob. No meds taken.  Feeling a little better in last 24h or so.   Past Medical History:  Diagnosis Date  . Environmental allergies   . History of pneumonia   . Hx of complete eye exam 06/18/2015   Dr. Dione BoozeGroat  . Neck fracture (HCC)    C5 -C6--playing football.  No surgery required:  wore neck brace for several months.  . Persistent asthma    Multiple(>5) ED visits for acute asthma exacerbations from 2007-2014    Past Surgical History:  Procedure Laterality Date  . WISDOM TOOTH EXTRACTION      Outpatient Medications Prior to Visit  Medication Sig Dispense Refill  . albuterol (PROVENTIL HFA;VENTOLIN HFA) 108 (90 BASE) MCG/ACT inhaler Inhale 2 puffs into the lungs every 2 (two) hours as needed. Shortness of breath and wheezing 1 Inhaler 1  . fluticasone (FLONASE) 50 MCG/ACT nasal spray Place 2 sprays into both nostrils daily. 16 g 6  . levofloxacin (LEVAQUIN) 500 MG tablet Take 1 tablet (500 mg total) by mouth daily. 5 tablet 0  . predniSONE (DELTASONE) 50 MG tablet Take 1 tablet (50 mg total) by mouth daily with breakfast. 5 tablet 0   No facility-administered medications prior to visit.     Allergies  Allergen Reactions  . Penicillins Anaphylaxis  . Azithromycin Hives  . Oxycodone-Acetaminophen Hives  . Doxycycline Rash    ROS As per HPI  PE: Blood pressure 106/72, pulse 70, temperature 98.2 F (36.8 C), temperature source Oral, resp. rate 16, weight 171 lb (77.6 kg), SpO2 99 %. VS: noted--normal. Gen: alert, NAD, NONTOXIC APPEARING. HEENT: eyes without injection, drainage, or swelling.  Ears: EACs clear, TMs  with normal light reflex and landmarks.  Nose: Clear rhinorrhea, with some dried, crusty exudate adherent to mildly injected mucosa.  No purulent d/c.  No paranasal sinus TTP.  No facial swelling.  Throat and mouth without focal lesion.  No pharyngial swelling, erythema, or exudate.   Neck: supple, no LAD.   LUNGS: CTA bilat, nonlabored resps.   CV: RRR, no m/r/g. EXT: no c/c/e SKIN: no rash  LABS:  none  IMPRESSION AND PLAN:  URI/viral syndrome: improving last 24h. Instructions: Take three otc generic ibuprofen tabs with food twice per day until you are not having any headache or body aches anymore.  Buy otc generic saline nasal spray and use in nose 3-4 times per day.  Letter written today stating pt ok to return to work now w/out any restrictions.  An After Visit Summary was printed and given to the patient.  FOLLOW UP: Return if symptoms worsen or fail to improve.  Signed:  Santiago BumpersPhil Clary Meeker, MD           09/27/2016

## 2016-09-27 NOTE — Progress Notes (Signed)
Pre visit review using our clinic review tool, if applicable. No additional management support is needed unless otherwise documented below in the visit note. 

## 2017-03-16 ENCOUNTER — Encounter: Payer: Self-pay | Admitting: Family Medicine

## 2017-03-16 ENCOUNTER — Ambulatory Visit (INDEPENDENT_AMBULATORY_CARE_PROVIDER_SITE_OTHER): Payer: BLUE CROSS/BLUE SHIELD | Admitting: Family Medicine

## 2017-03-16 VITALS — BP 106/69 | HR 58 | Temp 98.0°F | Resp 20 | Wt 178.0 lb

## 2017-03-16 DIAGNOSIS — L259 Unspecified contact dermatitis, unspecified cause: Secondary | ICD-10-CM

## 2017-03-16 MED ORDER — TRIAMCINOLONE ACETONIDE 0.1 % EX CREA: 1.0000 "application " | TOPICAL_CREAM | Freq: Two times a day (BID) | CUTANEOUS | 0 refills | Status: DC

## 2017-03-16 MED ORDER — FLUOCINOLONE ACETONIDE 0.01 % EX CREA: TOPICAL_CREAM | Freq: Two times a day (BID) | CUTANEOUS | 0 refills | Status: DC

## 2017-03-16 MED ORDER — METHYLPREDNISOLONE ACETATE 80 MG/ML IJ SUSP
80.0000 mg | Freq: Once | INTRAMUSCULAR | Status: AC
  Administered 2017-03-16: 80 mg via INTRAMUSCULAR

## 2017-03-16 NOTE — Patient Instructions (Signed)
This looks like poison ivy or oak.  Use the cream on it as prescribed.  The steroid shot will help it clear.  Try not to scratch. If not resolved in 2 weeks or worsening, please be seen again.     Poison Ivy Dermatitis Poison ivy dermatitis is redness and soreness (inflammation) of the skin. It is caused by a chemical that is found on the leaves of the poison ivy plant. You may also have itching, a rash, and blisters. Symptoms often clear up in 1-2 weeks. You may get this condition by touching a poison ivy plant. You can also get it by touching something that has the chemical on it. This may include animals or objects that have come in contact with the plant. Follow these instructions at home: General instructions  Take or apply over-the-counter and prescription medicines only as told by your doctor.  If you touch poison ivy, wash your skin with soap and cold water right away.  Use hydrocortisone creams or calamine lotion as needed to help with itching.  Take oatmeal baths as needed. Use colloidal oatmeal. You can get this at a pharmacy or grocery store. Follow the instructions on the package.  Do not scratch or rub your skin.  While you have the rash, wash your clothes right after you wear them. Prevention  Know what poison ivy looks like so you can avoid it. This plant has three leaves with flowering branches on a single stem. The leaves are glossy. They have uneven edges that come to a point at the front.  If you have touched poison ivy, wash with soap and water right away. Be sure to wash under your fingernails.  When hiking or camping, wear long pants, a long-sleeved shirt, tall socks, and hiking boots. You can also use a lotion on your skin that helps to prevent contact with the chemical on the plant.  If you think that your clothes or outdoor gear came in contact with poison ivy, rinse them off with a garden hose before you bring them inside your house. Contact a doctor  if:  You have open sores in the rash area.  You have more redness, swelling, or pain in the affected area.  You have redness that spreads beyond the rash area.  You have fluid, blood, or pus coming from the affected area.  You have a fever.  You have a rash over a large area of your body.  You have a rash on your eyes, mouth, or genitals.  Your rash does not get better after a few days. Get help right away if:  Your face swells or your eyes swell shut.  You have trouble breathing.  You have trouble swallowing. This information is not intended to replace advice given to you by your health care provider. Make sure you discuss any questions you have with your health care provider. Document Released: 09/24/2010 Document Revised: 01/28/2016 Document Reviewed: 01/28/2015 Elsevier Interactive Patient Education  [employer]2018 [employer] Inc[employer]

## 2017-03-16 NOTE — Progress Notes (Signed)
Ricky [name] , 1996-05-20, 21 y.o., male MRN: [id number] Patient Care Team    Relationship Specialty Notifications Start End  McGowen, Maryjean MornPhilip H, MD PCP - General Family Medicine  01/09/13     Chief Complaint  Patient presents with  . Rash    abdomen and legs     Subjective: Pt presents for an OV with complaints of rash of 1 week  duration.  Associated symptoms include redness and itching. Rash is spreading. He was out in the woods around the time and could have been exposed to plant allergen. He has never had poison ivy before. He denies any changes in detergents or personal hygiene products. Pt has tried nothing to ease their symptoms.   No flowsheet data found.  Allergies  Allergen Reactions  . Penicillins Anaphylaxis  . Azithromycin Hives  . Oxycodone-Acetaminophen Hives  . Doxycycline Rash   Social History  Substance Use Topics  . Smoking status: Never Smoker  . Smokeless tobacco: Never Used  . Alcohol use No   Past Medical History:  Diagnosis Date  . Environmental allergies   . History of pneumonia   . Hx of complete eye exam 06/18/2015   Dr. Dione BoozeGroat  . Neck fracture (HCC)    C5 -C6--playing football.  No surgery required:  wore neck brace for several months.  . Persistent asthma    Multiple(>5) ED visits for acute asthma exacerbations from 2007-2014   Past Surgical History:  Procedure Laterality Date  . WISDOM TOOTH EXTRACTION     Family History  Problem Relation Age of Onset  . Arthritis Maternal Grandmother   . Hypertension Maternal Grandmother   . Sudden death Neg Hx   . Hyperlipidemia Neg Hx   . Heart attack Neg Hx   . Diabetes Neg Hx    Allergies as of 03/16/2017      Reactions   Penicillins Anaphylaxis   Azithromycin Hives   Oxycodone-acetaminophen Hives   Doxycycline Rash      Medication List       Accurate as of 03/16/17 11:48 AM. Always use your most recent med list.          albuterol 108 (90 Base) MCG/ACT inhaler Commonly known as:   PROVENTIL HFA;VENTOLIN HFA Inhale 2 puffs into the lungs every 2 (two) hours as needed. Shortness of breath and wheezing   fluocinolone 0.01 % cream Commonly known as:  VANOS Apply topically 2 (two) times daily.   fluticasone 50 MCG/ACT nasal spray Commonly known as:  FLONASE Place 2 sprays into both nostrils daily.       All past medical history, surgical history, allergies, family history, immunizations andmedications were updated in the EMR today and reviewed under the history and medication portions of their EMR.     ROS: Negative, with the exception of above mentioned in HPI   Objective:  BP 106/69 (BP Location: Left Arm, Patient Position: Sitting, Cuff Size: Normal)   Pulse (!) 58   Temp 98 F (36.7 C)   Resp 20   Wt 178 lb (80.7 kg)   SpO2 100%   BMI 23.48 kg/m  Body mass index is 23.48 kg/m. Gen: Afebrile. No acute distress. Nontoxic in appearance, well developed, well nourished.  HENT: AT. [place]. MMM Skin: erythema base with small raised blisters rash right arm, right stomach, bilateral LE, No purpura or petechiae.  .  No exam data present No results found. No results found for this or any previous visit (from the past  24 hour(s)).  Assessment/Plan: Ricky [name] is a 21 y.o. male present for OV for  Contact dermatitis, unspecified contact dermatitis type, unspecified trigger - IM depo medrol today.  - steroid cream, with instructions on proper use.  - F/U 2 weeks if not improved.    Reviewed expectations re: course of current medical issues.  Discussed self-management of symptoms.  Outlined signs and symptoms indicating need for more acute intervention.  Patient verbalized understanding and all questions were answered.  Patient received an After-Visit Summary.    No orders of the defined types were placed in this encounter.    Note is dictated utilizing voice recognition software. Although note has been proof read prior to signing, occasional  typographical errors still can be missed. If any questions arise, please do not hesitate to call for verification.   electronically signed by:  Felix Pacini, DO  [facility] - [place]

## 2017-07-17 DIAGNOSIS — Z113 Encounter for screening for infections with a predominantly sexual mode of transmission: Secondary | ICD-10-CM | POA: Diagnosis not present

## 2017-07-17 DIAGNOSIS — Z114 Encounter for screening for human immunodeficiency virus [HIV]: Secondary | ICD-10-CM | POA: Diagnosis not present

## 2017-08-18 ENCOUNTER — Encounter: Payer: Self-pay | Admitting: Family Medicine

## 2017-08-18 ENCOUNTER — Encounter: Payer: Self-pay | Admitting: *Deleted

## 2017-08-18 ENCOUNTER — Ambulatory Visit: Payer: BLUE CROSS/BLUE SHIELD | Admitting: Family Medicine

## 2017-08-18 VITALS — BP 105/64 | HR 71 | Temp 98.2°F | Resp 16 | Wt 169.0 lb

## 2017-08-18 DIAGNOSIS — J029 Acute pharyngitis, unspecified: Secondary | ICD-10-CM

## 2017-08-18 DIAGNOSIS — R69 Illness, unspecified: Secondary | ICD-10-CM

## 2017-08-18 DIAGNOSIS — J111 Influenza due to unidentified influenza virus with other respiratory manifestations: Secondary | ICD-10-CM

## 2017-08-18 LAB — POC INFLUENZA A&B (BINAX/QUICKVUE)
Influenza A, POC: NEGATIVE
Influenza B, POC: NEGATIVE

## 2017-08-18 LAB — POCT RAPID STREP A (OFFICE): RAPID STREP A SCREEN: NEGATIVE

## 2017-08-18 NOTE — Progress Notes (Signed)
OFFICE VISIT  08/18/2017   CC:  Chief Complaint  Patient presents with  . URI   HPI:    Patient is a 21 y.o.  male with a history of persistent asthma and allergic rhinitis who presents for "sinus". Onset 2 d/a with ear pain. Last night started to feel ST, get stuffy and runny nose, lots of PND.  Not much cough. Subjective f/c last night.  Rash.  Diffuse body aches.  Appetite down.  No SOB but some wheezing that he has used albut for.  No n/v/d. No otc meds tried.  Past Medical History:  Diagnosis Date  . Environmental allergies   . History of pneumonia   . Hx of complete eye exam 06/18/2015   Dr. Dione BoozeGroat  . Neck fracture (HCC)    C5 -C6--playing football.  No surgery required:  wore neck brace for several months.  . Persistent asthma    Multiple(>5) ED visits for acute asthma exacerbations from 2007-2014    Past Surgical History:  Procedure Laterality Date  . WISDOM TOOTH EXTRACTION      Outpatient Medications Prior to Visit  Medication Sig Dispense Refill  . albuterol (PROVENTIL HFA;VENTOLIN HFA) 108 (90 BASE) MCG/ACT inhaler Inhale 2 puffs into the lungs every 2 (two) hours as needed. Shortness of breath and wheezing 1 Inhaler 1  . fluticasone (FLONASE) 50 MCG/ACT nasal spray Place 2 sprays into both nostrils daily. 16 g 6  . triamcinolone cream (KENALOG) 0.1 % Apply 1 application topically 2 (two) times daily. (Patient not taking: Reported on 08/18/2017) 30 g 0   No facility-administered medications prior to visit.     Allergies  Allergen Reactions  . Penicillins Anaphylaxis  . Azithromycin Hives  . Oxycodone-Acetaminophen Hives  . Doxycycline Rash    ROS As per HPI  PE: Blood pressure 105/64, pulse 71, temperature 98.2 F (36.8 C), temperature source Oral, resp. rate 16, weight 169 lb (76.7 kg), SpO2 98 %. VS: noted--normal. Gen: alert, NAD, NONTOXIC APPEARING. HEENT: eyes without injection, drainage, or swelling.  Ears: EACs clear, TMs with normal light  reflex and landmarks.  Nose: Clear rhinorrhea, with some dried, crusty exudate adherent to mildly injected mucosa.  No purulent d/c.  No paranasal sinus TTP.  No facial swelling.  Throat and mouth without focal lesion.  No pharyngial swelling, erythema, or exudate.   Neck: supple, no LAD.   LUNGS: CTA bilat, nonlabored resps.   CV: RRR, no m/r/g. EXT: no c/c/e SKIN: no rash  LABS:   Rapid strep: NEG Flu A/B: NEG  IMPRESSION AND PLAN:  Viral syndrome. Symptomatic care discussed. Instructions: Get otc generic robitussin DM OR Mucinex DM and use as directed on the packaging for cough and congestion. Use otc generic saline nasal spray 2-3 times per day to irrigate/moisturize your nasal passages. Generic zyrtec D as directed on packaging. Hydrate, rest.  An After Visit Summary was printed and given to the patient.  FOLLOW UP: Return if symptoms worsen or fail to improve.  Signed:  Santiago BumpersPhil Kash [place], MD           08/18/2017

## 2017-08-18 NOTE — Patient Instructions (Signed)
Get otc generic robitussin DM OR Mucinex DM and use as directed on the packaging for cough and congestion. Use otc generic saline nasal spray 2-3 times per day to irrigate/moisturize your nasal passages.  Generic zyrtec D as directed on packaging.

## 2017-08-20 LAB — CULTURE, GROUP A STREP
MICRO NUMBER: 81410697
SPECIMEN QUALITY:: ADEQUATE

## 2018-08-07 DIAGNOSIS — M545 Low back pain: Secondary | ICD-10-CM | POA: Diagnosis not present

## 2018-08-07 DIAGNOSIS — S339XXA Sprain of unspecified parts of lumbar spine and pelvis, initial encounter: Secondary | ICD-10-CM | POA: Diagnosis not present

## 2018-08-07 DIAGNOSIS — M62830 Muscle spasm of back: Secondary | ICD-10-CM | POA: Diagnosis not present

## 2018-08-08 ENCOUNTER — Ambulatory Visit: Payer: Self-pay | Admitting: Family Medicine

## 2018-08-24 ENCOUNTER — Ambulatory Visit: Payer: BLUE CROSS/BLUE SHIELD | Admitting: Family Medicine

## 2018-08-24 ENCOUNTER — Encounter: Payer: Self-pay | Admitting: Family Medicine

## 2018-08-24 VITALS — BP 125/81 | HR 89 | Temp 98.1°F | Resp 16 | Ht 73.0 in | Wt 179.0 lb

## 2018-08-24 DIAGNOSIS — K6289 Other specified diseases of anus and rectum: Secondary | ICD-10-CM | POA: Diagnosis not present

## 2018-08-24 NOTE — Progress Notes (Signed)
OFFICE VISIT  08/24/2018   CC:  Chief Complaint  Patient presents with  . Cyst    Knot near anus    HPI:    Patient is a 22 y.o.  male who presents for a "knot near anus". It is gone now! 5 d/a he noted a painful lump near anus.  Over time the pain diminished and the lump shrank and disappeared.  No bleeding.  Hurt when he wiped for a while. Has some constipation but takes no med for this.  Past Medical History:  Diagnosis Date  . Environmental allergies   . History of pneumonia   . Hx of complete eye exam 06/18/2015   Dr. Dione BoozeGroat  . Neck fracture (HCC)    C5 -C6--playing football.  No surgery required:  wore neck brace for several months.  . Persistent asthma    Multiple(>5) ED visits for acute asthma exacerbations from 2007-2014    Past Surgical History:  Procedure Laterality Date  . WISDOM TOOTH EXTRACTION      Outpatient Medications Prior to Visit  Medication Sig Dispense Refill  . albuterol (PROVENTIL HFA;VENTOLIN HFA) 108 (90 BASE) MCG/ACT inhaler Inhale 2 puffs into the lungs every 2 (two) hours as needed. Shortness of breath and wheezing 1 Inhaler 1  . fluticasone (FLONASE) 50 MCG/ACT nasal spray Place 2 sprays into both nostrils daily. 16 g 6   No facility-administered medications prior to visit.     Allergies  Allergen Reactions  . Penicillins Anaphylaxis  . Azithromycin Hives  . Oxycodone-Acetaminophen Hives  . Doxycycline Rash    ROS As per HPI  PE: Blood pressure 125/81, pulse 89, temperature 98.1 F (36.7 C), temperature source Oral, resp. rate 16, height 6\' 1"  (1.854 m), weight 179 lb (81.2 kg), SpO2 98 %. Gen: Alert, well appearing.  Patient is oriented to person, place, time, and situation. AFFECT: pleasant, lucid thought and speech. Anal exam: no fissures, no erythema, no fistula, no area of induration or nodularity or fluctuance. He has a 1 cm mildly full, pink external hemorrhoid at 5 oclock position where he said he had been hurting.  No  tenderness, no sign of thrombosis.  LABS:    Chemistry      Component Value Date/Time   NA 137 07/27/2012 1315   K 3.6 07/27/2012 1315   CL 101 07/27/2012 1315   CO2 27 07/27/2012 1315   BUN 9 07/27/2012 1315   CREATININE 0.89 07/27/2012 1315      Component Value Date/Time   CALCIUM 9.8 07/27/2012 1315      IMPRESSION AND PLAN:  1) Anal pain: likely recent inflamed and possibly thrombosed external hemorrhoid. Resolved now. Discouraged him from doing repetitive heavy lifting or prolonged sitting on commode. Signs/symptoms to call or return for were reviewed and pt expressed understanding.  An After Visit Summary was printed and given to the patient.  FOLLOW UP: Return if symptoms worsen or fail to improve.  Signed:  Santiago BumpersPhil Ezella Kell, MD           08/24/2018

## 2019-11-28 ENCOUNTER — Other Ambulatory Visit: Payer: Self-pay

## 2019-11-28 ENCOUNTER — Ambulatory Visit (INDEPENDENT_AMBULATORY_CARE_PROVIDER_SITE_OTHER): Payer: 59 | Admitting: Family Medicine

## 2019-11-28 ENCOUNTER — Encounter: Payer: Self-pay | Admitting: Family Medicine

## 2019-11-28 VITALS — BP 122/76 | HR 74 | Temp 97.9°F | Resp 16 | Ht 73.0 in | Wt 191.6 lb

## 2019-11-28 DIAGNOSIS — N410 Acute prostatitis: Secondary | ICD-10-CM | POA: Diagnosis not present

## 2019-11-28 DIAGNOSIS — K6289 Other specified diseases of anus and rectum: Secondary | ICD-10-CM

## 2019-11-28 DIAGNOSIS — K644 Residual hemorrhoidal skin tags: Secondary | ICD-10-CM

## 2019-11-28 DIAGNOSIS — R350 Frequency of micturition: Secondary | ICD-10-CM

## 2019-11-28 LAB — POCT URINALYSIS DIPSTICK
Bilirubin, UA: NEGATIVE
Blood, UA: NEGATIVE
Glucose, UA: NEGATIVE
Ketones, UA: NEGATIVE
Leukocytes, UA: NEGATIVE
Nitrite, UA: NEGATIVE
Protein, UA: NEGATIVE
Spec Grav, UA: 1.015 (ref 1.010–1.025)
Urobilinogen, UA: 0.2 E.U./dL
pH, UA: 6.5 (ref 5.0–8.0)

## 2019-11-28 MED ORDER — LEVOFLOXACIN 500 MG PO TABS: 500.0000 mg | ORAL_TABLET | Freq: Every day | ORAL | 0 refills | Status: DC

## 2019-11-28 NOTE — Progress Notes (Signed)
OFFICE VISIT  11/28/2019   CC:  Chief Complaint  Patient presents with  . Hemorrhoids   HPI:    Patient is a 24 y.o.  male who presents for c/o "pain in bottom half", "bladder", "etc". Onset about a 2 wks ago, felt lumps, hurts to wipe anus, occ blood on toilet tissue. Applied otc ointment 2 night and stopped b/c it helped a little.   Then about a week ago he got lower abd pain/suprapubic pain, started to urinate a lot more, penis hurts some in between urinations but no dysuria, no hematuria,no penile d/c.  Still having all the sx's now.  Sometimes has to strain some to get urine to come out, sometimes incomplete emptying feeling.  The urinary frequency and urgency started first 1 wk ago. No otc meds for these sx's.  No fever, no nausea.  No genital rash. Monogamous, no protection, no new sex partners.  No hx of STD.  Some intermittent constipation but nothing chronic. Does a lot of heavy lifting.  No use of otc laxatives.  ROS: no fevers, no CP, no SOB, no wheezing, no cough, no dizziness, no HAs.  Poison ivy rash on  Both lower legs.  no melena/hematochezia.  No polyuria or polydipsia.  No myalgias or arthralgias.  No focal weakness, paresthesias, or tremors.  No acute vision or hearing abnormalities. No n/v/d. No palpitations.     Past Medical History:  Diagnosis Date  . Environmental allergies   . History of pneumonia   . Hx of complete eye exam 06/18/2015   Dr. Katy Fitch  . Neck fracture ([place])    C5 -C6--playing football.  No surgery required:  wore neck brace for several months.  . Persistent asthma    Multiple(>5) ED visits for acute asthma exacerbations from 2007-2014    Past Surgical History:  Procedure Laterality Date  . WISDOM TOOTH EXTRACTION      Outpatient Medications Prior to Visit  Medication Sig Dispense Refill  . albuterol (PROVENTIL HFA;VENTOLIN HFA) 108 (90 BASE) MCG/ACT inhaler Inhale 2 puffs into the lungs every 2 (two) hours as needed. Shortness of  breath and wheezing (Patient not taking: Reported on 11/28/2019) 1 Inhaler 1  . fluticasone (FLONASE) 50 MCG/ACT nasal spray Place 2 sprays into both nostrils daily. (Patient not taking: Reported on 11/28/2019) 16 g 6   No facility-administered medications prior to visit.    Allergies  Allergen Reactions  . Penicillins Anaphylaxis  . Azithromycin Hives  . Oxycodone-Acetaminophen Hives  . Doxycycline Rash    ROS As per HPI  PE: Blood pressure 122/76, pulse 74, temperature 97.9 F (36.6 C), temperature source Temporal, resp. rate 16, height 6\' 1"  (1.854 m), weight 191 lb 9.6 oz (86.9 kg), SpO2 95 %. Body mass index is 25.28 kg/m.  Gen: Alert, well appearing.  Patient is oriented to person, place, time, and situation. AFFECT: pleasant, lucid thought and speech. CV: RRR, no m/r/g.   LUNGS: CTA bilat, nonlabored resps, good aeration in all lung fields. ABD: soft, moderate TTP in midline lower abd/suprapubic region, otherwise nontender.  There was mild suprapubic fullness.  No guarding or rebound, ND, BS normal.  No hepatospenomegaly or mass.  No bruits. Anus: no hemorrhoids, no tenderness, no fissures, no fistulas.  A few anal tags are present. Digital exam revealed no mass or blood or stool.  Palpation of prostate elicited strong need to urinate but not really tender.  NO prostatic fluctuance or induration.   LABS:    Chemistry  Component Value Date/Time   NA 137 07/27/2012 1315   K 3.6 07/27/2012 1315   CL 101 07/27/2012 1315   CO2 27 07/27/2012 1315   BUN 9 07/27/2012 1315   CREATININE 0.89 07/27/2012 1315      Component Value Date/Time   CALCIUM 9.8 07/27/2012 1315     POC CC dipstick UA today: all NORMAL  IMPRESSION AND PLAN:  1) Anal pain: some hemorrhoid pain recently, but the majority of his problem of late is acute prostatitis. UA normal. C/S sent for completeness today. Treat with levaquin 500 mg qd x 14d.  An After Visit Summary was printed and given to  the patient.  FOLLOW UP: Return if symptoms worsen or fail to improve.  Signed:  Santiago Bumpers, MD           11/28/2019

## 2019-11-28 NOTE — Progress Notes (Signed)
Per PCP, called [facility] in [place], transferred [facility] prescription to [facility] in [facility].

## 2019-11-29 LAB — URINE CULTURE
MICRO NUMBER:: 10294127
Result:: NO GROWTH
SPECIMEN QUALITY:: ADEQUATE

## 2019-12-02 ENCOUNTER — Ambulatory Visit: Payer: BLUE CROSS/BLUE SHIELD | Admitting: Family Medicine

## 2020-01-24 ENCOUNTER — Telehealth: Payer: Self-pay

## 2020-01-24 NOTE — Telephone Encounter (Signed)
Can patient be scheduled with Dr.Kuneff or should patient go to urgent care

## 2020-01-24 NOTE — Telephone Encounter (Signed)
Contacted patient's fiance and spoke to patient in order to get Verbal okay to speak with fiance since she is not on DPR.  I advised that since he is in so much pain, he needs to go to ER.  Patient has been keeping some fluids and food down but today he is in excruciating pain and is moaning in the background.  Patient's fiance verbalized understanding to report to ER.

## 2020-01-24 NOTE — Telephone Encounter (Signed)
Medication cannot be sent without appt. See if in office provider has openings and offer virtual appt.

## 2020-01-24 NOTE — Telephone Encounter (Signed)
Patients fiance called in stating that patient has had upset stomach since 01/19/20 from a party where he did get very intoxicated. He did vomit on Sunday but thinks that comes from the alcohol. He has been feeling nauseas everyday since Sunday. Wants to know if he needs to come into the office, or if this is just a long lasting hangover. Also if he can get something sent in for nausea    Please call and advise

## 2020-03-27 ENCOUNTER — Encounter: Payer: 59 | Admitting: Family Medicine

## 2020-07-17 ENCOUNTER — Other Ambulatory Visit: Payer: Self-pay

## 2020-07-17 ENCOUNTER — Ambulatory Visit (INDEPENDENT_AMBULATORY_CARE_PROVIDER_SITE_OTHER): Payer: 59 | Admitting: Family Medicine

## 2020-07-17 ENCOUNTER — Encounter: Payer: Self-pay | Admitting: Family Medicine

## 2020-07-17 VITALS — BP 120/72 | HR 69 | Temp 97.5°F | Resp 16 | Ht 73.0 in | Wt 181.0 lb

## 2020-07-17 DIAGNOSIS — R350 Frequency of micturition: Secondary | ICD-10-CM

## 2020-07-17 DIAGNOSIS — N410 Acute prostatitis: Secondary | ICD-10-CM

## 2020-07-17 DIAGNOSIS — S064X9A Epidural hemorrhage with loss of consciousness of unspecified duration, initial encounter: Secondary | ICD-10-CM

## 2020-07-17 DIAGNOSIS — S0190XA Unspecified open wound of unspecified part of head, initial encounter: Secondary | ICD-10-CM

## 2020-07-17 DIAGNOSIS — K6289 Other specified diseases of anus and rectum: Secondary | ICD-10-CM

## 2020-07-17 LAB — POCT URINALYSIS DIPSTICK
Bilirubin, UA: NEGATIVE
Blood, UA: NEGATIVE
Glucose, UA: NEGATIVE
Ketones, UA: NEGATIVE
Leukocytes, UA: NEGATIVE
Nitrite, UA: NEGATIVE
Protein, UA: NEGATIVE
Spec Grav, UA: 1.025 (ref 1.010–1.025)
Urobilinogen, UA: 0.2 E.U./dL
pH, UA: 6 (ref 5.0–8.0)

## 2020-07-17 MED ORDER — LEVOFLOXACIN 500 MG PO TABS: 500.0000 mg | ORAL_TABLET | Freq: Every day | ORAL | 0 refills | Status: DC

## 2020-07-17 MED ORDER — HYDROCORTISONE (PERIANAL) 2.5 % EX CREA: 1.0000 "application " | TOPICAL_CREAM | Freq: Two times a day (BID) | CUTANEOUS | 2 refills | Status: DC

## 2020-07-17 NOTE — Progress Notes (Signed)
OFFICE VISIT  07/17/2020  CC:  Chief Complaint  Patient presents with  . Anal pain    x2 weeks  . Frequent urination   HPI:    Patient is a 24 y.o. male who presents for pain in anal area. Of note, I last saw him 11/2019.   A/P as of that time:  "Anal pain: some hemorrhoid pain recently, but the majority of his problem of late is acute prostatitis. UA normal. C/S sent for completeness today. Treat with levaquin 500 mg qd x 14d."  HPI: Onset of signif anal discomfort after he had been driving on a long road trip for about a week (went on [place] country road trip for his honeymoon---got back a few days ago).  Over the final week of the road trip things grad worsened. Some pelvic fullness, discomfort, some urinary urgency and mild dysuria at end of urination, anal discomf upon urination--mild.  No gross hematuria.   Some pain with BMs, has been using otc prep H and helping some.  No anal bleeding, no blood in stool.  No f/c/abd pain/flank pain, or n/v.  Past Medical History:  Diagnosis Date  . Environmental allergies   . History of pneumonia   . Hx of complete eye exam 06/18/2015   Dr. Dione Booze  . Neck fracture (HCC)    C5 -C6--playing football.  No surgery required:  wore neck brace for several months.  . Persistent asthma    Multiple(>5) ED visits for acute asthma exacerbations from 2007-2014    Past Surgical History:  Procedure Laterality Date  . WISDOM TOOTH EXTRACTION     MEDS: NONE  Allergies  Allergen Reactions  . Penicillins Anaphylaxis  . Azithromycin Hives  . Oxycodone-Acetaminophen Hives  . Doxycycline Rash    ROS As per HPI  PE: Vitals with BMI 07/17/2020 11/28/2019 08/24/2018  Height 6\' 1"  6\' 1"  6\' 1"   Weight 181 lbs 191 lbs 10 oz 179 lbs  BMI 23.89 25.28 23.62  Systolic 120 122  Diastolic 72 76 81  Pulse 69 74 89     Gen: Alert, well appearing.  Patient is oriented to person, place, time, and situation. AFFECT: pleasant, lucid thought  and speech. Anal exam: a few small, non-thrombosed hemorrhoids are visible and palpable but not tender.  No fissure.  I did not do digital exam today.  LABS:  No results found for: TSH Lab Results  Component Value Date   WBC 8.4 07/27/2012   HGB 13.4 07/27/2012   HCT 38.0 (L) 07/27/2012   MCV 86.6 07/27/2012   PLT 262 07/27/2012   Lab Results  Component Value Date   CREATININE 0.89 07/27/2012   BUN 9 07/27/2012   NA 137 07/27/2012   K 3.6 07/27/2012   CL 101 07/27/2012   CO2 27 07/27/2012   POC CC dipstick UA today: NORMAL  IMPRESSION AND PLAN:  Prostatitis suspected, also some hemorrhoids flare up. This is from his long period spent sitting, riding/driving car across country. Has hx of same thing once in the past about 8 mo ago. Resolved with levaquin x 14d and anal steroid cream last time so I rx'd these again today.  An After Visit Summary was printed and given to the patient.  FOLLOW UP: Return if symptoms worsen or fail to improve.  Signed:  07/29/2012, MD           07/17/2020

## 2020-07-18 LAB — URINE CULTURE
MICRO NUMBER:: 11197462
Result:: NO GROWTH
SPECIMEN QUALITY:: ADEQUATE

## 2021-09-08 ENCOUNTER — Other Ambulatory Visit: Payer: Self-pay

## 2021-09-08 ENCOUNTER — Encounter: Payer: Self-pay | Admitting: Family Medicine

## 2021-09-08 ENCOUNTER — Ambulatory Visit (INDEPENDENT_AMBULATORY_CARE_PROVIDER_SITE_OTHER): Payer: BC Managed Care – PPO | Admitting: Family Medicine

## 2021-09-08 VITALS — BP 126/78 | HR 68 | Temp 97.7°F | Wt 213.0 lb

## 2021-09-08 DIAGNOSIS — B356 Tinea cruris: Secondary | ICD-10-CM

## 2021-09-08 NOTE — Progress Notes (Signed)
OFFICE VISIT  09/08/2021  CC:  Chief Complaint  Patient presents with   Rash    Located on upper right thigh. Has been present for 4-5 months, had been using his wife's eczema cream bid before running out.   HPI:    Patient is a 26 y.o. male who presents for rash on thigh.  HPI: Onset about 5 months ago, reddish and itchy rash in right side groin crease/upper inner thigh. Applied his girlfriend's eczema cream and this did help the itch but the rash never went away and in fact enlarged a little bit.  He has now run out of her cream. No history of eczema.  Past Medical History:  Diagnosis Date   Environmental allergies    History of pneumonia    Hx of complete eye exam 06/18/2015   Dr. Dione Booze   Neck fracture [facility])    C5 -C6--playing football.  No surgery required:  wore neck brace for several months.   Persistent asthma    Multiple(>5) ED visits for acute asthma exacerbations from 2007-2014    Past Surgical History:  Procedure Laterality Date   WISDOM TOOTH EXTRACTION      Outpatient Medications Prior to Visit  Medication Sig Dispense Refill   albuterol (PROVENTIL HFA;VENTOLIN HFA) 108 (90 BASE) MCG/ACT inhaler Inhale 2 puffs into the lungs every 2 (two) hours as needed. Shortness of breath and wheezing (Patient not taking: Reported on 11/28/2019) 1 Inhaler 1   fluticasone (FLONASE) 50 MCG/ACT nasal spray Place 2 sprays into both nostrils daily. (Patient not taking: Reported on 11/28/2019) 16 g 6   hydrocortisone (ANUSOL-HC) 2.5 % rectal cream Place 1 application rectally 2 (two) times daily. (Patient not taking: Reported on 09/08/2021) 30 g 2   levofloxacin (LEVAQUIN) 500 MG tablet Take 1 tablet (500 mg total) by mouth daily. (Patient not taking: Reported on 09/08/2021) 14 tablet 0   No facility-administered medications prior to visit.    Allergies  Allergen Reactions   Penicillins Anaphylaxis   Azithromycin Hives   Oxycodone-Acetaminophen Hives   Doxycycline Rash     ROS As per HPI  PE: Vitals with BMI 09/08/2021 07/17/2020 11/28/2019  Height - 6\' 1"  6\' 1"   Weight 213 lbs 181 lbs 191 lbs 10 oz  BMI - 23.89 25.28  Systolic 126 120  Diastolic 78 72 76  Pulse 68 69 74     Physical Exam  Gen: Alert, well appearing.  Patient is oriented to person, place, time, and situation. AFFECT: pleasant, lucid thought and speech. SKIN: R groin crease and uppermost aspect of inner R thigh with approx tennis ball sized area of pinkish erythema/hyperpigmentation with fairly well demarcated borders.  No vesicles or pustules.  LABS:  None  IMPRESSION AND PLAN:  Tinea cruris. Lamisil cream bid otc recommended. Try to keep area cool and dry.  An After Visit Summary was printed and given to the patient.  FOLLOW UP: Return if symptoms worsen or fail to improve.  Signed:  , MD           09/08/2021

## 2021-09-08 NOTE — Patient Instructions (Addendum)
Apply over the counter cream called lamisil (generic is fine) twice a day---it may take a few weeks for this rash to completely resolve.   Jock Itch Jock itch is an itchy rash in the groin and upper thigh area. It is a skin infection that is caused by a type of germ (fungus). The rash usually goes away in 2-3 weeks with treatment. What are the causes? This condition may be caused by: Touching a fungal infection elsewhere on your body, such as athlete's foot, and then touching your groin area. Sharing towels or clothing, such as socks or shoes, with someone who has a fungal infection. What increases the risk? This condition is most common in men and adolescent boys. You are also more likely to develop the condition if you: Are in a hot, humid climate. Wear tight-fitting clothes or wet bathing suits for a long time. Play sports. Are overweight. Have diabetes. Have a weakened body defense system (immune system). Sweat a lot. What are the signs or symptoms? Symptoms of this condition include: A red, pink, or brown rash in the groin area. There may be blisters. The rash may spread to your thighs, the opening between the butt (anus), and the butt. Dry and scaly skin on or around the rash. Itchiness. How is this treated? Treatment for this condition may include: Medicine to kill the fungus (antifungal). This may be one of the following: Skin cream. Ointment. Powder. Medicine that you take by mouth. Skin cream or ointment to reduce itching. Lifestyle changes, such as wearing looser clothing and caring for your skin. Follow these instructions at home: Skin care Use skin creams, ointments, or powders exactly as told by your doctor. Wear clothes that are loose. Clothes should not rub against your groin area. Men should wear boxer shorts or loose underwear. Keep your groin area clean and dry. Change your underwear every day. Change out of wet bathing suits as soon as you can. After  bathing, use a different towel to dry your groin area. Dry the area gently and completely. Avoid hot baths and showers. Hot water can make itching worse. Do not scratch the area. General instructions Take and apply over-the-counter and prescription medicines only as told by your doctor. Do not share towels or clothing with other people. Wash your hands often with soap and water for at least 20 seconds, especially after touching your groin area. If you cannot use soap and water, use hand sanitizer. When at the gym: Always wear shoes, especially in the shower and around the swimming pool. Keep any cuts covered. Clean any mats or equipment before using them. Shower as soon as you are done working out. Keep all follow-up visits. Contact a doctor if: Your rash: Gets worse. Does not get better after 2 weeks of treatment. Spreads. Comes back after treatment is done. You have a fever. You have new or more redness, swelling, or pain around your rash. You have fluid, blood, or pus coming from your rash. Summary Jock itch is an itchy rash. It affects the groin and upper thigh area. Jock itch usually goes away in 2-3 weeks with treatment. Keep your groin area clean and dry. This information is not intended to replace advice given to you by your health care provider. Make sure you discuss any questions you have with your health care provider. Document Revised: 11/10/2020 Document Reviewed: 11/10/2020 Elsevier Patient Education  [place].

## 2021-11-26 ENCOUNTER — Telehealth: Payer: Self-pay

## 2021-11-26 NOTE — Telephone Encounter (Signed)
Pt's wife advised of recommendations. ?

## 2021-11-26 NOTE — Telephone Encounter (Signed)
This localized reaction to a sting is still within normal limits. ?Try Benadryl 25mg  over-the-counter a couple of times a day to see if this makes a difference. ?

## 2021-11-26 NOTE — Telephone Encounter (Signed)
Spoke with pt's wife, pt got stung 4 day ago. Denies having nausea/vomiting/diarrhea, stomach cramps, pale skin,or hives. Sting is located mid shin to ankle area. Has just been taking ibuprofen but no topical cream or ointments used. Appt offered but pt does not get off until 4:30 ? ?Please review and advise ? ?

## 2021-11-26 NOTE — Telephone Encounter (Signed)
Patient wife, Devin Going [facility]) calling for patient about bee sting.  He was stung about 1 week ago on ankle.  Area is still red, swollen and itchy.  Is this normal?  They have used OTC meds to help but wanting to know if he should still have side effects of bee sting a week later. ? ?Please call [facility] at [phone] okay to leave message. ?

## 2022-05-30 ENCOUNTER — Ambulatory Visit: Payer: BC Managed Care – PPO | Admitting: Family Medicine

## 2022-06-03 ENCOUNTER — Encounter: Payer: Self-pay | Admitting: Family Medicine

## 2022-06-03 ENCOUNTER — Ambulatory Visit (INDEPENDENT_AMBULATORY_CARE_PROVIDER_SITE_OTHER): Payer: BC Managed Care – PPO | Admitting: Family Medicine

## 2022-06-03 VITALS — BP 112/70 | HR 61 | Temp 98.1°F | Ht 73.0 in | Wt 208.4 lb

## 2022-06-03 DIAGNOSIS — J029 Acute pharyngitis, unspecified: Secondary | ICD-10-CM

## 2022-06-03 DIAGNOSIS — R0981 Nasal congestion: Secondary | ICD-10-CM

## 2022-06-03 DIAGNOSIS — J019 Acute sinusitis, unspecified: Secondary | ICD-10-CM

## 2022-06-03 LAB — POCT INFLUENZA A/B
Influenza A, POC: NEGATIVE
Influenza B, POC: NEGATIVE

## 2022-06-03 LAB — POCT RAPID STREP A (OFFICE): Rapid Strep A Screen: NEGATIVE

## 2022-06-03 MED ORDER — ALBUTEROL SULFATE HFA 108 (90 BASE) MCG/ACT IN AERS: 2.0000 | INHALATION_SPRAY | RESPIRATORY_TRACT | 1 refills | Status: AC | PRN

## 2022-06-03 MED ORDER — LEVOFLOXACIN 500 MG PO TABS: 500.0000 mg | ORAL_TABLET | Freq: Every day | ORAL | 0 refills | Status: DC

## 2022-06-03 NOTE — Progress Notes (Signed)
OFFICE VISIT  06/03/2022  CC:  Chief Complaint  Patient presents with   Sore Throat    X2 weeks, Pt also has runny nose, nasal congestion. Denies body aces, chills or fever. Thought he was getting better. He has used cough drops but no meds used.    Patient is a 26 y.o. male who presents for sore throat.  HPI: About 2 wk hx nasal cong/sinus pressure, nasal mucous, PND, ST.  Minimal cough. No f/c/malaise.  Eating/drinking fine.   ST/pain swallowing returned after a forceful cough a couple days ago. No wheezing or shortness of breath.  Past Medical History:  Diagnosis Date   Environmental allergies    History of pneumonia    Hx of complete eye exam 06/18/2015   Dr. Dione Booze   Neck fracture [facility])    C5 -C6--playing football.  No surgery required:  wore neck brace for several months.   Persistent asthma    Multiple(>5) ED visits for acute asthma exacerbations from 2007-2014    Past Surgical History:  Procedure Laterality Date   WISDOM TOOTH EXTRACTION      Outpatient Medications Prior to Visit  Medication Sig Dispense Refill   albuterol (PROVENTIL HFA;VENTOLIN HFA) 108 (90 BASE) MCG/ACT inhaler Inhale 2 puffs into the lungs every 2 (two) hours as needed. Shortness of breath and wheezing (Patient not taking: Reported on 11/28/2019) 1 Inhaler 1   fluticasone (FLONASE) 50 MCG/ACT nasal spray Place 2 sprays into both nostrils daily. (Patient not taking: Reported on 11/28/2019) 16 g 6   hydrocortisone (ANUSOL-HC) 2.5 % rectal cream Place 1 application rectally 2 (two) times daily. (Patient not taking: Reported on 09/08/2021) 30 g 2   levofloxacin (LEVAQUIN) 500 MG tablet Take 1 tablet (500 mg total) by mouth daily. (Patient not taking: Reported on 09/08/2021) 14 tablet 0   No facility-administered medications prior to visit.    Allergies  Allergen Reactions   Penicillins Anaphylaxis   Azithromycin Hives   Oxycodone-Acetaminophen Hives   Doxycycline Rash    ROS As per HPI  PE:     06/03/2022   10:59 AM 09/08/2021   10:15 AM 07/17/2020    3:05 PM  Vitals with BMI  Height 6\' 1"   6\' 1"   Weight 208 lbs 6 oz 213 lbs 181 lbs  BMI 27.5  23.89  Systolic 112 126  Diastolic 70 78 72  Pulse 61 68 69     Physical Exam  VS: noted--normal. Gen: alert, NAD, NONTOXIC APPEARING. HEENT: eyes without injection, drainage, or swelling.  Ears: EACs clear, TMs with normal light reflex and landmarks.  Nose: Clear rhinorrhea, with some dried, crusty exudate adherent to mildly injected mucosa.  No purulent d/c.  No paranasal sinus TTP.  No facial swelling.  Throat and mouth without focal lesion.  No pharyngial swelling, erythema, or exudate.  Tonsils 2+ size bilat, symmetric. One tonsolith visible on R. Neck: supple, no LAD.   LUNGS: CTA bilat, nonlabored resps.   CV: RRR, no m/r/g. EXT: no c/c/e SKIN: no rash  LABS:   Rapid strep: neg Flu A/B today: neg  IMPRESSION AND PLAN:  Acute sinusitis. He has multiple antibiotic allergies. Prescribed levofloxacin 500 mg daily x7 days. Recommended saline nasal spray to 2 to 3 hours as needed.  I think his recent worsening of throat pain is some pharyngeal muscle and strap muscle strain from a very forceful cough.  Recommended ibuprofen 600 mg 3 times daily as needed.  Expect this pain to resolve in  a few days at the most.   An After Visit Summary was printed and given to the patient.  FOLLOW UP: Return if symptoms worsen or fail to improve.  Signed:  Crissie Sickles, MD           06/03/2022

## 2022-09-30 DIAGNOSIS — M545 Low back pain, unspecified: Secondary | ICD-10-CM | POA: Diagnosis not present

## 2022-10-14 DIAGNOSIS — M545 Low back pain, unspecified: Secondary | ICD-10-CM | POA: Diagnosis not present

## 2022-10-21 DIAGNOSIS — M545 Low back pain, unspecified: Secondary | ICD-10-CM | POA: Diagnosis not present

## 2022-10-27 DIAGNOSIS — M545 Low back pain, unspecified: Secondary | ICD-10-CM | POA: Diagnosis not present

## 2022-11-04 DIAGNOSIS — M545 Low back pain, unspecified: Secondary | ICD-10-CM | POA: Diagnosis not present

## 2022-11-10 DIAGNOSIS — M545 Low back pain, unspecified: Secondary | ICD-10-CM | POA: Diagnosis not present

## 2023-03-08 ENCOUNTER — Ambulatory Visit: Payer: BC Managed Care – PPO | Admitting: Family Medicine

## 2023-03-08 ENCOUNTER — Encounter: Payer: Self-pay | Admitting: Family Medicine

## 2023-03-08 VITALS — BP 123/77 | HR 59 | Temp 97.5°F | Wt 213.4 lb

## 2023-03-08 DIAGNOSIS — L740 Miliaria rubra: Secondary | ICD-10-CM

## 2023-03-08 MED ORDER — TRIAMCINOLONE ACETONIDE 0.1 % EX CREA: 1.0000 | TOPICAL_CREAM | Freq: Two times a day (BID) | CUTANEOUS | 1 refills | Status: DC

## 2023-03-08 NOTE — Progress Notes (Signed)
Ricky [name] , [birth year], 27 y.o., male MRN: [id number] Patient Care Team    Relationship Specialty Notifications Start End  McGowen, Maryjean Morn, MD PCP - General Family Medicine  01/09/13     Chief Complaint  Patient presents with   Rash    2-3 weeks has used cortisone and powders; goes away and will come back      Subjective: Ricky [name] is a 27 y.o. Pt presents for an OV with complaints of itchy red rash between buttocks of 3 weeks duration.  Associated symptoms include itchy and recurring.  Pt has tried cortisone cream and powders to ease their symptoms.  He is a Psychologist, occupational and works in a very hot environment.      09/08/2021   10:16 AM 11/28/2019    2:34 PM  Depression screen PHQ 2/9  Decreased Interest 0 0  Down, Depressed, Hopeless 0 0  PHQ - 2 Score 0 0    Allergies  Allergen Reactions   Penicillins Anaphylaxis   Azithromycin Hives   Oxycodone-Acetaminophen Hives   Doxycycline Rash   Social History   Social History Narrative   Married 2021.    Has 1 bro and 1 sister.   No T/A/Ds.   Past Medical History:  Diagnosis Date   Environmental allergies    History of pneumonia    Hx of complete eye exam 06/18/2015   Dr. Dione Booze   Neck fracture [facility])    C5 -C6--playing football.  No surgery required:  wore neck brace for several months.   Persistent asthma    Multiple(>5) ED visits for acute asthma exacerbations from 2007-2014   Past Surgical History:  Procedure Laterality Date   WISDOM TOOTH EXTRACTION     Family History  Problem Relation Age of Onset   Arthritis Maternal Grandmother    Hypertension Maternal Grandmother    Sudden death Neg Hx    Hyperlipidemia Neg Hx    Heart attack Neg Hx    Diabetes Neg Hx    Allergies as of 03/08/2023       Reactions   Penicillins Anaphylaxis   Azithromycin Hives   Oxycodone-acetaminophen Hives   Doxycycline Rash        Medication List        Accurate as of March 08, 2023  8:13 AM. If you have any  questions, ask your nurse or doctor.          albuterol 108 (90 Base) MCG/ACT inhaler Commonly known as: VENTOLIN HFA Inhale 2 puffs into the lungs every 2 (two) hours as needed. Shortness of breath and wheezing   levofloxacin 500 MG tablet Commonly known as: Levaquin Take 1 tablet (500 mg total) by mouth daily.        All past medical history, surgical history, allergies, family history, immunizations andmedications were updated in the EMR today and reviewed under the history and medication portions of their EMR.     ROS Negative, with the exception of above mentioned in HPI   Objective:  BP 123/77   Pulse (!) 59   Temp (!) 97.5 F (36.4 C)   Wt 213 lb 6.4 oz (96.8 kg)   SpO2 97%   BMI 28.15 kg/m  Body mass index is 28.15 kg/m. Physical Exam Vitals and nursing note reviewed.  Constitutional:      General: He is not in acute distress.    Appearance: Normal appearance. He is not ill-appearing, toxic-appearing or diaphoretic.  HENT:  Head: Normocephalic and atraumatic.  Eyes:     General: No scleral icterus.       Right eye: No discharge.        Left eye: No discharge.     Extraocular Movements: Extraocular movements intact.     Pupils: Pupils are equal, round, and reactive to light.  Skin:    General: Skin is warm and dry.     Coloration: Skin is not jaundiced or pale.     Findings: Rash (fine red raised rash midline right buttock.) present.  Neurological:     Mental Status: He is alert and oriented to person, place, and time. Mental status is at baseline.  Psychiatric:        Mood and Affect: Mood normal.        Behavior: Behavior normal.        Thought Content: Thought content normal.        Judgment: Judgment normal.      No results found. No results found. No results found for this or any previous visit (from the past 24 hour(s)).  Assessment/Plan: Ricky [name] is a 27 y.o. male present for OV for  1. Heat rash Keep area cool and dry Cool  compresses, cold showers.  Underwear that is light and wicking.  TUCS pads can help relieve symptoms.  Kenalog cream for for itch.  F/u prn  Reviewed expectations re: course of current medical issues. Discussed self-management of symptoms. Outlined signs and symptoms indicating need for more acute intervention. Patient verbalized understanding and all questions were answered. Patient received an After-Visit Summary.    No orders of the defined types were placed in this encounter.  No orders of the defined types were placed in this encounter.  Referral Orders  No referral(s) requested today     Note is dictated utilizing voice recognition software. Although note has been proof read prior to signing, occasional typographical errors still can be missed. If any questions arise, please do not hesitate to call for verification.   electronically signed by:  Felix Pacini, DO  [facility] [place]

## 2023-03-08 NOTE — Patient Instructions (Signed)
  Heat rash -- also known as prickly heat and miliaria -- isn't just for babies. It affects adults, too, especially in hot, humid conditions. Heat rash occurs when sweat is trapped in the skin. Symptoms can range from small blisters to deep, inflamed lumps. Some forms of heat rash are very itchy.  Two types of heat rash Heat rash Miliaria rubra (A), one type of heat rash, appears as clusters of small, inflamed blister-like bumps that can produce intense itching. Miliaria crystallina (B), another type of heat rash, appears as clear, fluid-filled bumps that don't hurt or itch.   Risk factors Factors that increase the risk of heat rash include: Being a newborn, as newborns have immature sweat ducts Living in a hot, humid climate Being physically active Being on bedrest for a long time and having a fever  Complications Heat rash usually heals without scarring. People with brown or Black skin are at risk of spots of skin that get lighter or darker in response to inflammatory skin conditions (postinflammatory hypopigmentation or hyperpigmentation). These changes usually go away within weeks or months.  A common complication is infection with bacteria, causing inflamed and itchy pustules.  Prevention To help protect from heat rash:  In hot weather, dress in loose, lightweight clothing that wicks moisture away from the skin.  In hot weather, limit physical activity. Stay in the shade or in an air-conditioned building. Or use a fan to circulate the air. Keep your sleeping area cool and well ventilated. Avoid creams and ointments that can block pores.

## 2023-04-20 ENCOUNTER — Ambulatory Visit (INDEPENDENT_AMBULATORY_CARE_PROVIDER_SITE_OTHER): Payer: BC Managed Care – PPO | Admitting: Family Medicine

## 2023-04-20 ENCOUNTER — Encounter: Payer: Self-pay | Admitting: Family Medicine

## 2023-04-20 VITALS — BP 118/75 | HR 63 | Temp 98.0°F | Ht 73.0 in | Wt 210.8 lb

## 2023-04-20 DIAGNOSIS — B372 Candidiasis of skin and nail: Secondary | ICD-10-CM

## 2023-04-20 MED ORDER — CLOTRIMAZOLE-BETAMETHASONE 1-0.05 % EX CREA: TOPICAL_CREAM | CUTANEOUS | 3 refills | Status: DC

## 2023-04-20 NOTE — Progress Notes (Signed)
OFFICE VISIT  04/20/2023  CC:  Chief Complaint  Patient presents with   Rash    Pt states it is about the same, has been using Kenalog cream rx'd by Dr.Kuneff on 7/3. It has spread some, currently on the front and back of legs    Patient is a 27 y.o. male who presents for rash.  HPI: Itchy groin rash for at least 6 weeks.  He works in the heat a lot and sweats a lot. On 03/08/2023 patient was diagnosed with heat rash and was prescribed Kenalog to apply as needed itching. He did this and it resulted in improved itching but the rash persists.  It is now worsening in the groin area.  He has been applying it twice a day. He recently ran out and has been applying hydrocortisone over-the-counter and cornstarch. The rash itches a lot but does not hurt.  Past Medical History:  Diagnosis Date   Environmental allergies    History of pneumonia    Hx of complete eye exam 06/18/2015   Dr. Dione Booze   Neck fracture [facility])    C5 -C6--playing football.  No surgery required:  wore neck brace for several months.   Persistent asthma    Multiple(>5) ED visits for acute asthma exacerbations from 2007-2014    Past Surgical History:  Procedure Laterality Date   WISDOM TOOTH EXTRACTION      Outpatient Medications Prior to Visit  Medication Sig Dispense Refill   triamcinolone cream (KENALOG) 0.1 % Apply 1 Application topically 2 (two) times daily. 45 g 1   albuterol (VENTOLIN HFA) 108 (90 Base) MCG/ACT inhaler Inhale 2 puffs into the lungs every 2 (two) hours as needed. Shortness of breath and wheezing (Patient not taking: Reported on 03/08/2023) 1 each 1   levofloxacin (LEVAQUIN) 500 MG tablet Take 1 tablet (500 mg total) by mouth daily. (Patient not taking: Reported on 03/08/2023) 7 tablet 0   No facility-administered medications prior to visit.    Allergies  Allergen Reactions   Penicillins Anaphylaxis   Azithromycin Hives   Oxycodone-Acetaminophen Hives   Doxycycline Rash    Review of Systems   As per HPI  PE:    04/20/2023   10:27 AM 03/08/2023    8:09 AM 06/03/2022   10:59 AM  Vitals with BMI  Height 6\' 1"   6\' 1"   Weight 210 lbs 13 oz 213 lbs 6 oz 208 lbs 6 oz  BMI 27.82  27.5  Systolic 118 123 283  Diastolic 75 77 70  Pulse 63 59 61     Physical Exam  Gen: Alert, well appearing.  Patient is oriented to person, place, time, and situation. Exam shows intergluteal region with significant dry deep pink macular rash without maceration.  Edges well-demarcated. GU region with similar generalized mildly erythematous macular rash with well-demarcated borders and satellite lesions. No maceration, no pustules or vesicles.  LABS:  none  IMPRESSION AND PLAN:  Intertrigo with suspected Candida. Lotrisone cream prescribed, apply 3 times daily.  He can cover with Aquaphor.  Expect 3 to 4 weeks for complete resolution. Reiterated the importance of trying to keep the area cool and dry.  An After Visit Summary was printed and given to the patient.  FOLLOW UP: No follow-ups on file.  Signed:  Santiago Bumpers, MD           04/20/2023

## 2023-05-01 ENCOUNTER — Telehealth: Payer: Self-pay

## 2023-05-01 MED ORDER — CLOTRIMAZOLE-BETAMETHASONE 1-0.05 % EX CREA: 1.0000 | TOPICAL_CREAM | Freq: Every day | CUTANEOUS | 0 refills | Status: AC

## 2023-05-01 NOTE — Telephone Encounter (Signed)
Prescription Request  05/01/2023  LOV:  04/20/23  What is the name of the medication or equipment?   clotrimazole-betamethasone (LOTRISONE) cream    Have you contacted your pharmacy to request a refill? No   Which pharmacy would you like this sent to?  [facility]   Patient notified that their request is being sent to the clinical staff for review and that they should receive a response within 2 business days.   Please advise at Mobile [phone] (mobile)

## 2023-05-23 ENCOUNTER — Telehealth: Payer: Self-pay | Admitting: Family Medicine

## 2023-05-23 NOTE — Telephone Encounter (Signed)
RF request for clotrimazole-betamethasone (LOTRISONE) cream  LOV: 04/20/23 Next ov: N/A Last written: 04[facility]20/23 ([facility]g,3[facility] [place]  The current [facility]. Will call wife and inform.

## 2023-05-23 NOTE — Telephone Encounter (Signed)
Aniken's wife called on behalf of Ricky [name] since he is working. He is requesting a refill on clotrimazole-betamethasone (LOTRISONE) cream.  The rash is improving, but he is concerned about running out of cream before it fully improves.  If a refill can be called in please give Ricky [name]  or his wife Ricky [name] at [phone]. **[facility]

## 2023-05-25 NOTE — Telephone Encounter (Signed)
Pt's wife advised medication can be transferred.

## 2023-07-14 ENCOUNTER — Encounter: Payer: Self-pay | Admitting: Family Medicine

## 2023-07-14 ENCOUNTER — Ambulatory Visit (INDEPENDENT_AMBULATORY_CARE_PROVIDER_SITE_OTHER): Payer: BC Managed Care – PPO | Admitting: Family Medicine

## 2023-07-14 VITALS — BP 121/81 | HR 83 | Ht 73.0 in | Wt 216.2 lb

## 2023-07-14 DIAGNOSIS — B372 Candidiasis of skin and nail: Secondary | ICD-10-CM

## 2023-07-14 MED ORDER — FLUCONAZOLE 150 MG PO TABS: ORAL_TABLET | ORAL | 0 refills | Status: AC

## 2023-07-14 MED ORDER — CLOTRIMAZOLE-BETAMETHASONE 1-0.05 % EX CREA: TOPICAL_CREAM | CUTANEOUS | 6 refills | Status: DC

## 2023-07-14 NOTE — Progress Notes (Signed)
OFFICE VISIT  07/14/2023  CC:  Chief Complaint  Patient presents with   Rash    Pt ran out of ointment prescribed at the time of his last visit. Rash started to flare up again after he ran out of cream. Itching, hurts to sit for long periods of time.     Patient is a 27 y.o. male who presents for ongoing rash. A/P as of last visit on 04/20/23: "Intertrigo with suspected Candida. Lotrisone cream prescribed, apply 3 times daily.  He can cover with Aquaphor.  Expect 3 to 4 weeks for complete resolution. Reiterated the importance of trying to keep the area cool and dry."  INTERIM HX:  He has an intertriginous rash in the groin area and intergluteal folds did gradually improve with use of Lotrisone.  It had nearly completely gone away but then he ran out of it several days ago and has been working again near heat and has had increased sweating.  The rash has returned.  Very itchy.  Past Medical History:  Diagnosis Date   Environmental allergies    History of pneumonia    Hx of complete eye exam 06/18/2015   Dr. Dione Booze   Neck fracture [facility])    C5 -C6--playing football.  No surgery required:  wore neck brace for several months.   Persistent asthma    Multiple(>5) ED visits for acute asthma exacerbations from 2007-2014    Past Surgical History:  Procedure Laterality Date   WISDOM TOOTH EXTRACTION      Outpatient Medications Prior to Visit  Medication Sig Dispense Refill   clotrimazole-betamethasone (LOTRISONE) cream Apply 1 Application topically daily. 30 g 0   albuterol (VENTOLIN HFA) 108 (90 Base) MCG/ACT inhaler Inhale 2 puffs into the lungs every 2 (two) hours as needed. Shortness of breath and wheezing (Patient not taking: Reported on 03/08/2023) 1 each 1   clotrimazole-betamethasone (LOTRISONE) cream Apply to affected area tid 45 g 3   triamcinolone cream (KENALOG) 0.1 % Apply 1 Application topically 2 (two) times daily. (Patient not taking: Reported on 07/14/2023) 45 g 1   No  facility-administered medications prior to visit.    Allergies  Allergen Reactions   Penicillins Anaphylaxis   Azithromycin Hives   Oxycodone-Acetaminophen Hives   Doxycycline Rash    Review of Systems  As per HPI  PE:    07/14/2023    2:26 PM 04/20/2023   10:27 AM 03/08/2023    8:09 AM  Vitals with BMI  Height 6\' 1"  6\' 1"    Weight 216 lbs 3 oz 210 lbs 13 oz 213 lbs 6 oz  BMI 28.53 27.82   Systolic 121 118 161  Diastolic 81 75 77  Pulse 83 63 59    Physical Exam  General: Alert and well-appearing. Skin: Intergluteal folds with erythematous rash, fairly well-demarcated borders.  No maceration. Inguinal region with some erythematous splotches coalescing in groin creases, right greater than left.  LABS:  none  IMPRESSION AND PLAN:  Candidal intertrigo. This is a recurrent thing for him because of his job--> exposed to excessive heat continuously. Diflucan 150 mg x 1 dose, also restart Lotrisone to affected area 3 times daily. When this resolves I recommended he continue to apply the Lotrisone once daily.  If not working in a hot environment and not sweating much then he can go without.   If rash recurs then he can start again was taking 1 Diflucan tab and increasing application of his Lotrisone to 3 times daily.  An After Visit Summary was printed and given to the patient.  FOLLOW UP: Return if symptoms worsen or fail to improve.  Signed:  Santiago Bumpers, MD           07/14/2023

## 2023-11-28 ENCOUNTER — Ambulatory Visit: Payer: Self-pay

## 2023-11-28 ENCOUNTER — Other Ambulatory Visit: Payer: Self-pay | Admitting: Urgent Care

## 2023-11-28 ENCOUNTER — Ambulatory Visit (INDEPENDENT_AMBULATORY_CARE_PROVIDER_SITE_OTHER): Payer: Self-pay | Admitting: Urgent Care

## 2023-11-28 ENCOUNTER — Encounter: Payer: Self-pay | Admitting: Urgent Care

## 2023-11-28 VITALS — BP 122/82 | HR 70 | Wt 207.4 lb

## 2023-11-28 DIAGNOSIS — S99911A Unspecified injury of right ankle, initial encounter: Secondary | ICD-10-CM

## 2023-11-28 DIAGNOSIS — S92134A Nondisplaced fracture of posterior process of right talus, initial encounter for closed fracture: Secondary | ICD-10-CM

## 2023-11-28 MED ORDER — DICLOFENAC SODIUM 75 MG PO TBEC: 75.0000 mg | DELAYED_RELEASE_TABLET | Freq: Two times a day (BID) | ORAL | 0 refills | Status: AC

## 2023-11-28 MED ORDER — HYDROCODONE-ACETAMINOPHEN 5-325 MG PO TABS: 1.0000 | ORAL_TABLET | Freq: Three times a day (TID) | ORAL | 0 refills | Status: AC | PRN

## 2023-11-28 NOTE — Progress Notes (Signed)
 Established Patient Office Visit  Subjective:  Patient ID: Ricky [name], male    DOB: June 18, 1996  Age: 28 y.o. MRN: [id number]  Chief Complaint  Patient presents with   Ankle Injury    Pt was plating basketball last week and twisted his right ankle. He has been trying to keep it elevated and using ice with no relief. Pt has not had an x ray.    Pt was playing basketball last week, jumped up for a rebound and landed wrong on R ankle, inverting it. Severe pain, swelling and bruising since. No OTC meds taken, but did borrow a walking boot from a friend. States that helped to some degree.  Ankle Injury  The incident occurred 5 to 7 days ago (Thursday evening 11/23/23). The incident occurred at the gym. The injury mechanism was an inversion injury and a twisting injury. The pain is present in the right ankle. The quality of the pain is described as aching, stabbing and shooting. The pain is moderate. The pain has been Constant since onset. Associated symptoms include an inability to bear weight (ambulating but favoring foot). Pertinent negatives include no loss of motion or loss of sensation. The symptoms are aggravated by movement, weight bearing and palpation. He has tried elevation and immobilization for the symptoms.    Patient Active Problem List   Diagnosis Date Noted   Frontal sinusitis 06/23/2015   Pain of midfoot 02/15/2013   Left ankle pain 01/21/2013   Ankle sprain 01/15/2013   Past Medical History:  Diagnosis Date   Environmental allergies    History of pneumonia    Hx of complete eye exam 06/18/2015   Dr. Dione Booze   Neck fracture [facility])    C5 -C6--playing football.  No surgery required:  wore neck brace for several months.   Persistent asthma    Multiple(>5) ED visits for acute asthma exacerbations from 2007-2014   Past Surgical History:  Procedure Laterality Date   WISDOM TOOTH EXTRACTION     Social History   Tobacco Use   Smoking status: Never   Smokeless tobacco:  Never  Vaping Use   Vaping status: Never Used  Substance Use Topics   Alcohol use: No   Drug use: No      ROS: as noted in HPI  Objective:     BP 122/82   Pulse 70   Wt 207 lb 6.4 oz (94.1 kg)   SpO2 96%   BMI 27.36 kg/m  BP Readings from Last 3 Encounters:  11/28/23 122/82  07/14/23 121/81  04/20/23 118/75   Wt Readings from Last 3 Encounters:  11/28/23 207 lb 6.4 oz (94.1 kg)  07/14/23 216 lb 3.2 oz (98.1 kg)  04/20/23 210 lb 12.8 oz (95.6 kg)      Physical Exam Vitals and nursing note reviewed.  Constitutional:      General: He is not in acute distress.    Appearance: Normal appearance. He is not ill-appearing, toxic-appearing or diaphoretic.  Cardiovascular:     Rate and Rhythm: Normal rate.  Pulmonary:     Effort: Pulmonary effort is normal. No respiratory distress.  Musculoskeletal:     Right knee: No bony tenderness. Normal range of motion. No tenderness.     Right lower leg: Tenderness present. No swelling or bony tenderness. No edema.     Right ankle: Swelling and ecchymosis present. Tenderness present over the lateral malleolus, ATF ligament, AITF ligament and posterior TF ligament. No medial malleolus, base of 5th metatarsal  or proximal fibula tenderness. Decreased range of motion. Normal pulse.     Right Achilles Tendon: Normal. No tenderness or defects. Thompson's test negative.     Right foot: Normal range of motion and normal capillary refill. Swelling, tenderness and bony tenderness present. Normal pulse.       Feet:  Feet:     Right foot:     Skin integrity: Skin integrity normal.  Neurological:     Mental Status: He is alert.      No results found for any visits on 11/28/23.  Last CBC Lab Results  Component Value Date   WBC 8.4 07/27/2012   HGB 13.4 07/27/2012   HCT 38.0 (L) 07/27/2012   MCV 86.6 07/27/2012   MCH 30.5 07/27/2012   RDW 13.0 07/27/2012   PLT 262 07/27/2012   Last metabolic panel Lab Results  Component Value Date    GLUCOSE 94 07/27/2012   NA 137 07/27/2012   K 3.6 07/27/2012   CL 101 07/27/2012   CO2 27 07/27/2012   BUN 9 07/27/2012   CREATININE 0.89 07/27/2012   CALCIUM 9.8 07/27/2012      The ASCVD Risk score (Arnett DK, et al., 2019) failed to calculate for the following reasons:   The 2019 ASCVD risk score is only valid for ages 39 to 1  Assessment & Plan:  Right ankle injury, initial encounter -     DG Ankle Complete Right; Future -     Diclofenac Sodium; Take 1 tablet (75 mg total) by mouth 2 (two) times daily with a meal.  Dispense: 28 tablet; Refill: 0  Given severity of bruising, I do suspect either a fracture or a complete ligamentous injury.  He defers the need for a stabilizing device as he has a boot at home. I encouraged continued use of this. Ice the ankle several times daily, elevate when possible. Rx for diclofenac BID with food x 7-14 days. STAT ankle xray now. 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 Will call with results of ankle xray when available.  No follow-ups on file.   Maretta Bees, PA

## 2023-11-28 NOTE — Patient Instructions (Addendum)
 Please go to [facility] now for a STAT ankle xray [facility] Address: [address] [place], [place] [address] Phone: [phone]  Please wear the boot you have at home until instructed to remove. Elevate your leg when possible. Ice the ankle 3x/day  Please take the anti-inflammatory medication called in today twice daily with food. Do not take any additional OTC NSAIDS (advil, motrin, ibuprofen, aleve, naproxen).   Will call with xray results to discuss further management

## 2023-11-29 ENCOUNTER — Telehealth: Payer: Self-pay

## 2023-11-29 ENCOUNTER — Other Ambulatory Visit: Payer: Self-pay | Admitting: Family Medicine

## 2023-11-29 DIAGNOSIS — S92134A Nondisplaced fracture of posterior process of right talus, initial encounter for closed fracture: Secondary | ICD-10-CM

## 2023-11-29 NOTE — Telephone Encounter (Signed)
 Reason for CRM: Pt needs imaging DG Ankle Complete Right (Accession [id number]) (Order [id number]) sent to [facility]. Pt has appt today at 3:40 p.m.  Britt sent over order results to [facility]

## 2023-11-29 NOTE — Telephone Encounter (Signed)
 Copied from CRM [id number]. Topic: Clinical - Medication Refill >> Nov 29, 2023  2:11 PM Eunice Blase wrote: Most Recent Primary Care Visit:  Provider: Maretta Bees  Department: [facility]  Visit Type: ACUTE  Date: 11/28/2023  Medication: HYDROcodone-acetaminophen (NORCO/VICODIN) 5-325 MG tablet    Has the patient contacted their pharmacy? Yes (Agent: If no, request that the patient contact the pharmacy for the refill. If patient does not wish to contact the pharmacy document the reason why and proceed with request.) (Agent: If yes, when and what did the pharmacy advise?)Pharmacy need PCP approval  Is this the correct pharmacy for this prescription? Yes If no, delete pharmacy and type the correct one.  This is the patient's preferred pharmacy:    [facility] STORE #[id number], [place] - [address] AT [facility] [address] [facility] [address] Phone: [phone] Fax: [phone]   Has the prescription been filled recently? Yes  Is the patient out of the medication? Yes  Has the patient been seen for an appointment in the last year OR does the patient have an upcoming appointment? Yes  Can we respond through MyChart? Yes  Agent: Please be advised that Rx refills may take up to 3 business days. We ask that you follow-up with your pharmacy.

## 2024-02-13 ENCOUNTER — Other Ambulatory Visit: Payer: Self-pay | Admitting: Family Medicine

## 2024-02-13 MED ORDER — CLOTRIMAZOLE-BETAMETHASONE 1-0.05 % EX CREA: TOPICAL_CREAM | CUTANEOUS | 0 refills | Status: DC

## 2024-02-13 NOTE — Telephone Encounter (Signed)
 Copied from CRM [id number]. Topic: Clinical - Medication Refill >> Feb 13, 2024  9:26 AM Alyse July wrote: Medication: clotrimazole -betamethasone  (LOTRISONE ) cream   Has the patient contacted their pharmacy? Yes  This is the patient's preferred pharmacy:   [id number] [place], [place] - [address] [place] [address] Phone: [phone] Fax: [phone]  Is this the correct pharmacy for this prescription? Yes If no, delete pharmacy and type the correct one.   Has the prescription been filled recently? No  Is the patient out of the medication? No  Has the patient been seen for an appointment in the last year OR does the patient have an upcoming appointment? Yes  Can we respond through MyChart? Yes  Agent: Please be advised that Rx refills may take up to 3 business days. We ask that you follow-up with your pharmacy.

## 2024-03-15 ENCOUNTER — Ambulatory Visit: Payer: Self-pay | Admitting: Family Medicine

## 2024-05-03 ENCOUNTER — Other Ambulatory Visit: Payer: Self-pay | Admitting: Family Medicine

## 2024-05-03 MED ORDER — CLOTRIMAZOLE-BETAMETHASONE 1-0.05 % EX CREA: TOPICAL_CREAM | CUTANEOUS | 0 refills | Status: AC

## 2024-05-03 NOTE — Telephone Encounter (Signed)
 Copied from CRM [id number]. Topic: Clinical - Medication Refill >> May 03, 2024  3:27 PM Franky GRADE wrote: Medication: clotrimazole -betamethasone  (LOTRISONE ) cream [[id number]]  Has the patient contacted their pharmacy? Yes, but they were not able to get a hold of anyone at the pharmacy (Agent: If no, request that the patient contact the pharmacy for the refill. If patient does not wish to contact the pharmacy document the reason why and proceed with request.) (Agent: If yes, when and what did the pharmacy advise?)  This is the patient's preferred pharmacy:   [id number] [place], [place] - [address] [place] [facility] [address] Phone: [phone] Fax: [phone]  Is this the correct pharmacy for this prescription? Yes If no, delete pharmacy and type the correct one.   Has the prescription been filled recently? No  Is the patient out of the medication? Yes  Has the patient been seen for an appointment in the last year OR does the patient have an upcoming appointment? No  Can we respond through MyChart? Yes  Agent: Please be advised that Rx refills may take up to 3 business days. We ask that you follow-up with your pharmacy.
